# Patient Record
Sex: Female | Born: 1960 | Race: White | Hispanic: No | Marital: Single | Smoking: Current every day smoker
Health system: Southern US, Community
[De-identification: ages and names within clinical notes are randomized; demographics above are authoritative.]

## PROBLEM LIST (undated history)

## (undated) DIAGNOSIS — Z972 Presence of dental prosthetic device (complete) (partial): Secondary | ICD-10-CM

## (undated) DIAGNOSIS — T8859XA Other complications of anesthesia, initial encounter: Secondary | ICD-10-CM

## (undated) DIAGNOSIS — F419 Anxiety disorder, unspecified: Secondary | ICD-10-CM

## (undated) HISTORY — DX: Anxiety disorder, unspecified: F41.9

## (undated) HISTORY — PX: VAGINAL HYSTERECTOMY: SUR661

## (undated) HISTORY — PX: ABDOMINAL HYSTERECTOMY: SHX81

## (undated) HISTORY — PX: TONSILLECTOMY: SUR1361

## (undated) HISTORY — PX: CHOLECYSTECTOMY: SHX55

---

## 2003-01-06 DIAGNOSIS — G4700 Insomnia, unspecified: Secondary | ICD-10-CM | POA: Insufficient documentation

## 2004-08-08 ENCOUNTER — Ambulatory Visit: Payer: Self-pay | Admitting: Internal Medicine

## 2005-12-24 ENCOUNTER — Ambulatory Visit: Payer: Self-pay | Admitting: Family Medicine

## 2006-04-03 ENCOUNTER — Ambulatory Visit: Payer: Self-pay | Admitting: Family Medicine

## 2010-08-13 HISTORY — PX: AUGMENTATION MAMMAPLASTY: SUR837

## 2010-10-14 ENCOUNTER — Ambulatory Visit: Payer: Self-pay | Admitting: Internal Medicine

## 2011-12-30 ENCOUNTER — Ambulatory Visit: Payer: Self-pay | Admitting: Internal Medicine

## 2011-12-30 LAB — COMPREHENSIVE METABOLIC PANEL
Albumin: 3.7 g/dL (ref 3.4–5.0)
Alkaline Phosphatase: 249 U/L — ABNORMAL HIGH (ref 50–136)
Anion Gap: 9 (ref 7–16)
BUN: 13 mg/dL (ref 7–18)
Calcium, Total: 9 mg/dL (ref 8.5–10.1)
Co2: 28 mmol/L (ref 21–32)
Creatinine: 0.87 mg/dL (ref 0.60–1.30)
EGFR (Non-African Amer.): >60
Potassium: 4.1 mmol/L (ref 3.5–5.1)
SGOT(AST): 92 U/L — ABNORMAL HIGH (ref 15–37)
SGPT (ALT): 127 U/L — ABNORMAL HIGH
Sodium: 142 mmol/L (ref 136–145)
Total Protein: 7.1 g/dL (ref 6.4–8.2)

## 2011-12-30 LAB — URINALYSIS, COMPLETE
Bacteria: NEGATIVE
Bilirubin,UR: NEGATIVE
Blood: NEGATIVE
Glucose,UR: NEGATIVE mg/dL (ref 0–75)
Ketone: NEGATIVE
Leukocyte Esterase: NEGATIVE
Nitrite: NEGATIVE
Ph: 6.5 (ref 4.5–8.0)
Protein: NEGATIVE
RBC,UR: NONE SEEN /HPF (ref 0–5)

## 2011-12-30 LAB — CBC WITH DIFFERENTIAL/PLATELET
Basophil #: 0.1 10*3/uL (ref 0.0–0.1)
Basophil %: 0.6 %
Eosinophil #: 0.1 10*3/uL (ref 0.0–0.7)
Eosinophil %: 1.4 %
Lymphocyte #: 3.5 10*3/uL (ref 1.0–3.6)
Lymphocyte %: 39.2 %
MCH: 29 pg (ref 26.0–34.0)
Neutrophil #: 4.6 10*3/uL (ref 1.4–6.5)
Platelet: 192 10*3/uL (ref 150–440)
RBC: 4.71 10*6/uL (ref 3.80–5.20)
RDW: 13.3 % (ref 11.5–14.5)
WBC: 8.8 10*3/uL (ref 3.6–11.0)

## 2012-01-03 ENCOUNTER — Ambulatory Visit: Payer: Self-pay | Admitting: Family Medicine

## 2012-01-29 ENCOUNTER — Ambulatory Visit: Payer: Self-pay | Admitting: Family Medicine

## 2012-01-29 LAB — CBC WITH DIFFERENTIAL/PLATELET
Basophil #: 0 10*3/uL (ref 0.0–0.1)
Eosinophil #: 0.2 10*3/uL (ref 0.0–0.7)
Lymphocyte #: 3.6 10*3/uL (ref 1.0–3.6)
MCH: 29.2 pg (ref 26.0–34.0)
MCHC: 33.9 g/dL (ref 32.0–36.0)
MCV: 86 fL (ref 80–100)
Monocyte #: 0.5 x10 3/mm (ref 0.2–0.9)
Neutrophil #: 5.2 10*3/uL (ref 1.4–6.5)
Platelet: 207 10*3/uL (ref 150–440)
RDW: 13.2 % (ref 11.5–14.5)

## 2012-01-29 LAB — URINALYSIS, COMPLETE
Bilirubin,UR: NEGATIVE
Glucose,UR: NEGATIVE mg/dL (ref 0–75)
Ketone: NEGATIVE
Leukocyte Esterase: NEGATIVE
Protein: NEGATIVE
Specific Gravity: 1.015 (ref 1.003–1.030)

## 2012-01-29 LAB — COMPREHENSIVE METABOLIC PANEL
Alkaline Phosphatase: 222 U/L — ABNORMAL HIGH (ref 50–136)
Anion Gap: 9 (ref 7–16)
BUN: 9 mg/dL (ref 7–18)
Bilirubin,Total: 0.5 mg/dL (ref 0.2–1.0)
Calcium, Total: 9.3 mg/dL (ref 8.5–10.1)
Chloride: 105 mmol/L (ref 98–107)
Creatinine: 0.86 mg/dL (ref 0.60–1.30)
EGFR (Non-African Amer.): >60
Glucose: 92 mg/dL (ref 65–99)
Osmolality: 285 (ref 275–301)
SGPT (ALT): 48 U/L
Sodium: 144 mmol/L (ref 136–145)

## 2012-01-31 LAB — URINE CULTURE

## 2012-05-15 ENCOUNTER — Ambulatory Visit: Payer: Self-pay | Admitting: Medical

## 2012-05-15 ENCOUNTER — Observation Stay: Payer: Self-pay | Admitting: Internal Medicine

## 2012-05-15 LAB — CBC
HCT: 37.4 % (ref 35.0–47.0)
HGB: 12.7 g/dL (ref 12.0–16.0)
MCH: 29.3 pg (ref 26.0–34.0)
MCHC: 34 g/dL (ref 32.0–36.0)
Platelet: 202 10*3/uL (ref 150–440)
RDW: 13 % (ref 11.5–14.5)

## 2012-05-15 LAB — COMPREHENSIVE METABOLIC PANEL
Albumin: 3.6 g/dL (ref 3.4–5.0)
Alkaline Phosphatase: 155 U/L — ABNORMAL HIGH (ref 50–136)
Anion Gap: 6 — ABNORMAL LOW (ref 7–16)
BUN: 11 mg/dL (ref 7–18)
Calcium, Total: 9.1 mg/dL (ref 8.5–10.1)
Chloride: 108 mmol/L — ABNORMAL HIGH (ref 98–107)
Co2: 31 mmol/L (ref 21–32)
Creatinine: 0.81 mg/dL (ref 0.60–1.30)
EGFR (African American): >60
EGFR (Non-African Amer.): >60
Glucose: 88 mg/dL (ref 65–99)
Osmolality: 288 (ref 275–301)
Potassium: 4 mmol/L (ref 3.5–5.1)
SGOT(AST): 144 U/L — ABNORMAL HIGH (ref 15–37)
SGPT (ALT): 117 U/L — ABNORMAL HIGH (ref 12–78)
Sodium: 145 mmol/L (ref 136–145)
Total Protein: 6.7 g/dL (ref 6.4–8.2)

## 2012-05-15 LAB — TROPONIN I: Troponin-I: <0.02 ng/mL

## 2012-05-16 DIAGNOSIS — R079 Chest pain, unspecified: Secondary | ICD-10-CM

## 2012-05-16 LAB — CK TOTAL AND CKMB (NOT AT ARMC)
CK, Total: 156 U/L (ref 21–215)
CK, Total: 166 U/L (ref 21–215)
CK-MB: 1.9 ng/mL (ref 0.5–3.6)
CK-MB: 1.7 ng/mL (ref 0.5–3.6)

## 2012-05-16 LAB — HEPATIC FUNCTION PANEL A (ARMC)
Albumin: 3.3 g/dL — ABNORMAL LOW (ref 3.4–5.0)
Alkaline Phosphatase: 148 U/L — ABNORMAL HIGH (ref 50–136)
Bilirubin, Direct: <0.1 mg/dL (ref 0.00–0.20)
SGOT(AST): 177 U/L — ABNORMAL HIGH (ref 15–37)
SGPT (ALT): 141 U/L — ABNORMAL HIGH (ref 12–78)
Total Protein: 6.2 g/dL — ABNORMAL LOW (ref 6.4–8.2)

## 2012-05-16 LAB — TROPONIN I
Troponin-I: <0.02 ng/mL
Troponin-I: <0.02 ng/mL

## 2012-10-17 ENCOUNTER — Other Ambulatory Visit: Payer: Self-pay | Admitting: Oncology

## 2013-08-13 HISTORY — PX: COLONOSCOPY: SHX5424

## 2013-10-10 ENCOUNTER — Encounter: Payer: Self-pay | Admitting: Oncology

## 2014-08-23 DIAGNOSIS — Z720 Tobacco use: Secondary | ICD-10-CM | POA: Insufficient documentation

## 2014-11-30 NOTE — H&P (Signed)
PATIENT NAME:  Shelby Gallagher MR#:  [id number] DATE OF BIRTH:  02-27-1961  DATE OF ADMISSION:  05/15/2012  PRIMARY CARE PHYSICIAN: Dr. Otilio Miu  REFERRING PHYSICIAN: Dr. Lenise Arena  CHIEF COMPLAINT: Chest pain.   HISTORY OF PRESENT ILLNESS: This is a 54 year old female who presents to the Emergency Room complaining of chest pain that began earlier this evening. She describes a feeling as a heaviness in the center of her chest which is then radiating down her left arm and her left arm became heavy and numb. She also associated with some diaphoresis, some shortness of breath and nausea but no vomiting. She was a passenger in her car when she developed those symptoms. They stopped and she took a couple of baby aspirins which seemed to alleviate some of her pain but it continued to persist therefore she was brought to the ER for further evaluation. In the Emergency Room patient continued to complain of heaviness in the center of her chest. Hospitalist service was contacted for further treatment and evaluation.   Patient denies ever having any symptoms like this before. She denies ever having myocardial infarction or CVA in the past.   REVIEW OF SYSTEMS: CONSTITUTIONAL: No documented fever. No weight gain. No weight loss. EYES: No blurred or double vision. ENT: No tinnitus. No postnasal drip. No redness of the oropharynx. RESPIRATORY: No cough, no wheeze, no hemoptysis. Positive dyspnea. CARDIOVASCULAR: Positive chest pain. No orthopnea, no palpitations, no syncope. GASTROINTESTINAL: Positive nausea. No vomiting, no diarrhea, no abdominal pain, no melena, no hematochezia. GENITOURINARY: No dysuria, no hematuria. ENDOCRINE: No polyuria or nocturia. No heat or cold intolerance. HEME: No anemia, no bruising, no bleeding. INTEGUMENTARY: No rashes. No lesions. MUSCULOSKELETAL: No arthritis, no swelling, no gout. NEUROLOGIC: No numbness, no tingling, no ataxia, no seizure-type activity. PSYCH: No  anxiety, no insomnia, no ADD.   PAST MEDICAL HISTORY: Tobacco abuse.   ALLERGIES: No known drug allergies.   SOCIAL HISTORY: Does smoke about a pack every other day, has smoked for the past 20+ years. Occasional alcohol use. No illicit drug abuse. Lives at home with her boyfriend.   FAMILY HISTORY: Mother is alive and healthy. Father is also alive but has a history of stroke and heart disease.   CURRENT MEDICATIONS: She takes clorazepate 15 mg as needed for insomnia and sleep.   PHYSICAL EXAM ON ADMISSION:  VITAL SIGNS: Vital signs are noted to be: Temperature is afebrile, pulse 89, respirations 18, blood pressure 125/69, sats 100% on 2 liters nasal cannula.   GENERAL: She is a pleasant appearing female in no apparent distress.   HEENT: She is atraumatic, normocephalic. Her extraocular muscles are intact. Pupils equal, reactive to light. Sclerae anicteric. No conjunctival injection. No pharyngeal erythema.   NECK: Supple. There is no jugular venous distention. No bruits, no lymphadenopathy, no thyromegaly.   HEART: Regular rate and rhythm. No murmurs, no rubs, no clicks.   LUNGS: Clear to auscultation bilaterally. No rales, no rhonchi, no wheezes.   ABDOMEN: Soft, flat, nontender, nondistended. Has good bowel sounds. No hepatosplenomegaly appreciated.   EXTREMITIES: No evidence of any cyanosis, clubbing, or peripheral edema. Has +2 pedal and radial pulses bilaterally.   NEUROLOGICAL: Patient is alert, awake, oriented x3 with no focal motor or sensory deficits appreciated bilaterally.   SKIN: Moist, warm with no rash appreciated.   LYMPHATIC: There is no cervical or axillary lymphadenopathy.   LABORATORY, DIAGNOSTIC, AND RADIOLOGICAL DATA: Serum glucose 88, BUN 11, creatinine 0.8, sodium 145, potassium 4,  chloride 108, bicarbonate 31, alkaline phosphatase 155, AST 144, ALT 117, albumin 3.6, troponin less than 0.02, white cell count 8.1, hemoglobin 12.7, hematocrit 37.4, platelet  count 202, d-dimer 0.3, INR 0.9.   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   Patient's EKG also showed normal sinus rhythm with normal axes and no evidence of any acute ST or T wave changes.   ASSESSMENT AND PLAN: This is a 54 year old female with ongoing tobacco abuse, anxiety presents to the hospital with chest pain.  1. Chest pain. Patient does have some typical symptoms for angina. She does have history of tobacco abuse. Her symptoms did mildly improve with some sublingual nitroglycerin and some aspirin therefore I will observe her overnight on telemetry. Follow serial cardiac markers. Continue baby aspirin, nitroglycerin, morphine and oxygen. Will get a stress Myoview in the morning. If her enzymes turn positive then will get cardiology consult for possible cardiac catheterization. I suspect most likely her chest pain is related to anxiety/musculoskeletal in nature.  2. Abnormal liver function tests. Patient does not have any abdominal pain. She says she has had an abnormal liver panel in the past although she does not know why. She does not have any history of heavy alcohol abuse. I will go ahead and check a hepatitis panel. I will also get a limited abdominal ultrasound in the morning and follow her liver function tests.  3. CODE STATUS: Patient is a FULL CODE.   TIME SPENT WITH ADMISSION: 45 minutes.   ____________________________ Belia Heman. Verdell Carmine, MD vjs:cms D: 05/15/2012 20:44:40 ET T: 05/16/2012 06:02:18 ET JOB#: [id number]  cc: Belia Heman. Verdell Carmine, MD, <Dictator> Juline Patch, MD  Henreitta Leber MD ELECTRONICALLY SIGNED 05/16/2012 12:25

## 2014-11-30 NOTE — Discharge Summary (Signed)
PATIENT NAME:  Shelby Gallagher MR#:  [id number] DATE OF BIRTH:  10-Apr-1961  DATE OF ADMISSION:  05/15/2012 DATE OF DISCHARGE:  05/16/2012  PRIMARY CARE PHYSICIAN: Dr. Otilio Miu    DISCHARGE DIAGNOSES:  1. Chest pain secondary to gastroesophageal reflux disease or reflux.  2. Transaminitis chronic with pending hepatitis panel and normal ultrasound.   LABORATORY, DIAGNOSTIC AND RADIOLOGICAL DATA:  Ultrasound of the abdomen which showed an absent gallbladder. No acute abnormalities of the liver.   Cardiac stress test Myoview which was normal.   ADMITTING HISTORY AND PHYSICAL AND HOSPITAL COURSE: Please see detailed history and physical dictated by Dr. Verdell Carmine. In brief, 54 year old patient presented to the Emergency Room complaining of left-sided chest pain radiating to left arm and some numbness associated with nausea. Patient was admitted for stress test. Patient had normal three sets of cardiac enzymes with no chest pain at the time of discharge. Patient was thought to have gastroesophageal reflux disease after a normal stress test, is being started on Prilosec over-the-counter twice a day along with aspirin 81 mg once a day at home and is being discharged home to follow up with her primary care physician. On the day of discharge patient's temperature is 98, blood pressure 104/70, saturating 96% on room air.   Her liver function tests are mildly elevated. Hepatitis panel is pending. Will be forwarded to primary care physician. Ultrasound of the abdomen was normal. This needs to be rechecked in 6 to 8 weeks with LFTs.   DISCHARGE MEDICATIONS:  1. Prilosec over-the-counter 20 mg oral twice a day.  2. Aspirin 81 mg oral once a day.   DISCHARGE INSTRUCTIONS: Follow with primary care physician in a week. Continue medications as prescribed. Patient will call her doctor or return to the Emergency Room if she has any worsening chest pain or shortness of breath.   TIME SPENT: Time spent today on  this discharge dictation along with coordinating care and counseling of the patient was 30 minutes.  ____________________________ Leia Alf Merikay Lesniewski, MD srs:cms D: 05/16/2012 12:46:14 ET T: 05/16/2012 12:57:23 ET JOB#: [id number]  cc: Alveta Heimlich R. Darvin Neighbours, MD, <Dictator> Juline Patch, MD Neita Carp MD ELECTRONICALLY SIGNED 06/09/2012 13:57

## 2015-10-06 ENCOUNTER — Ambulatory Visit: Payer: Self-pay | Admitting: Family Medicine

## 2015-10-12 ENCOUNTER — Encounter: Payer: Self-pay | Admitting: Family Medicine

## 2015-10-12 ENCOUNTER — Ambulatory Visit (INDEPENDENT_AMBULATORY_CARE_PROVIDER_SITE_OTHER): Payer: Self-pay | Admitting: Family Medicine

## 2015-10-12 VITALS — BP 120/80 | HR 80 | Temp 98.1°F | Ht 67.0 in | Wt 188.0 lb

## 2015-10-12 DIAGNOSIS — F410 Panic disorder [episodic paroxysmal anxiety] without agoraphobia: Secondary | ICD-10-CM

## 2015-10-12 DIAGNOSIS — J019 Acute sinusitis, unspecified: Secondary | ICD-10-CM

## 2015-10-12 LAB — POCT INFLUENZA A/B
INFLUENZA A, POC: NEGATIVE
INFLUENZA B, POC: NEGATIVE

## 2015-10-12 MED ORDER — AMOXICILLIN 250 MG/5ML PO SUSR: ORAL | Status: DC

## 2015-10-12 MED ORDER — GUAIFENESIN-CODEINE 100-10 MG/5ML PO SYRP: 5.0000 mL | ORAL_SOLUTION | Freq: Three times a day (TID) | ORAL | Status: DC | PRN

## 2015-10-12 MED ORDER — CLORAZEPATE DIPOTASSIUM 7.5 MG PO TABS: 7.5000 mg | ORAL_TABLET | Freq: Every day | ORAL | Status: DC

## 2015-10-12 MED ORDER — AMOXICILLIN 500 MG PO CAPS: 500.0000 mg | ORAL_CAPSULE | Freq: Three times a day (TID) | ORAL | Status: DC

## 2015-10-12 NOTE — Progress Notes (Signed)
Name: Shelby Gallagher   MRN: [id number]    DOB: 1961/02/16   Date:10/12/2015       Progress Note  Subjective  Chief Complaint  Chief Complaint  Patient presents with  . Sinusitis    cough and cong, sore throat- no production/ dry cough, feels like lymph nodes are swollen  . Anxiety    needs refill on clorazepate    Sinusitis This is a new problem. The current episode started yesterday. The problem has been waxing and waning since onset. Maximum temperature: low grade. Associated symptoms include congestion, coughing, headaches, shortness of breath, sinus pressure and a sore throat. Pertinent negatives include no chills, diaphoresis, ear pain or neck pain. The treatment provided mild relief.  Anxiety Presents for follow-up visit. Symptoms include nervous/anxious behavior and shortness of breath. Patient reports no chest pain, dizziness, insomnia, nausea, palpitations or suicidal ideas.      No problem-specific assessment & plan notes found for this encounter.   Past Medical History  Diagnosis Date  . Anxiety     Past Surgical History  Procedure Laterality Date  . Vaginal hysterectomy    . Cholecystectomy    . Tonsillectomy    . Colonoscopy  2015    repeat in 3 years- Dr Janalyn Rouse- [facility]    Family History  Problem Relation Age of Onset  . Cancer Mother   . Hypertension Mother   . Cancer Father   . Hypertension Father   . Stroke Father     Social History   Social History  . Marital Status: Married    Spouse Name: N/A  . Number of Children: N/A  . Years of Education: N/A   Occupational History  . Not on file.   Social History Main Topics  . Smoking status: Current Every Day Smoker  . Smokeless tobacco: Not on file  . Alcohol Use: 0.0 oz/week    0 Standard drinks or equivalent per week  . Drug Use: No  . Sexual Activity: Yes   Other Topics Concern  . Not on file   Social History Narrative  . No narrative on file    Allergies  Allergen Reactions  .  Morphine     Other reaction(s): VOMITING  . Shellfish-Derived Products     Other reaction(s): SWELLING Other reaction(s): SWELLING     Review of Systems  Constitutional: Negative for fever, chills, weight loss, malaise/fatigue and diaphoresis.  HENT: Positive for congestion, sinus pressure and sore throat. Negative for ear discharge and ear pain.   Eyes: Negative for blurred vision.  Respiratory: Positive for cough and shortness of breath. Negative for sputum production and wheezing.   Cardiovascular: Negative for chest pain, palpitations and leg swelling.  Gastrointestinal: Negative for heartburn, nausea, abdominal pain, diarrhea, constipation, blood in stool and melena.  Genitourinary: Negative for dysuria, urgency, frequency and hematuria.  Musculoskeletal: Negative for myalgias, back pain, joint pain and neck pain.  Skin: Negative for rash.  Neurological: Positive for headaches. Negative for dizziness, tingling, sensory change and focal weakness.  Endo/Heme/Allergies: Negative for environmental allergies and polydipsia. Does not bruise/bleed easily.  Psychiatric/Behavioral: Negative for depression and suicidal ideas. The patient is nervous/anxious. The patient does not have insomnia.      Objective  Filed Vitals:   10/12/15 1119  BP: 120/80  Pulse: 80  Temp: 98.1 F (36.7 C)  TempSrc: Oral  Height: 5\' 7"  (1.702 m)  Weight: 188 lb (85.276 kg)    Physical Exam  Constitutional: She is well-developed, well-nourished,  and in no distress. No distress.  HENT:  Head: Normocephalic and atraumatic.  Right Ear: External ear normal.  Left Ear: External ear normal.  Nose: Nose normal.  Mouth/Throat: Oropharynx is clear and moist.  Eyes: Conjunctivae and EOM are normal. Pupils are equal, round, and reactive to light. Right eye exhibits no discharge. Left eye exhibits no discharge.  Neck: Normal range of motion. Neck supple. No JVD present. No thyromegaly present.  Cardiovascular:  Normal rate, regular rhythm, normal heart sounds and intact distal pulses.  Exam reveals no gallop and no friction rub.   No murmur heard. Pulmonary/Chest: Effort normal and breath sounds normal.  Abdominal: Soft. Bowel sounds are normal. She exhibits no mass. There is no tenderness. There is no guarding.  Musculoskeletal: Normal range of motion. She exhibits no edema.  Lymphadenopathy:    She has no cervical adenopathy.  Neurological: She is alert. She has normal reflexes.  Skin: Skin is warm and dry. She is not diaphoretic.  Psychiatric: Mood and affect normal.  Nursing note and vitals reviewed.     Assessment & Plan  Problem List Items Addressed This Visit    None    Visit Diagnoses    Subacute sinusitis, unspecified location    -  Primary    Relevant Medications    guaiFENesin-codeine (ROBITUSSIN AC) 100-10 MG/5ML syrup    amoxicillin (AMOXIL) 250 MG/5ML suspension    Other Relevant Orders    POCT Influenza A/B (Completed)    Panic attacks        Relevant Medications    clorazepate (TRANXENE) 7.5 MG tablet         Dr. Volanda Mangine [facility]  10/12/2015

## 2015-10-18 ENCOUNTER — Other Ambulatory Visit: Payer: Self-pay

## 2015-10-19 ENCOUNTER — Other Ambulatory Visit: Payer: Self-pay

## 2015-10-19 MED ORDER — LEVOFLOXACIN 500 MG PO TABS: 500.0000 mg | ORAL_TABLET | Freq: Every day | ORAL | Status: DC

## 2016-06-11 ENCOUNTER — Ambulatory Visit (INDEPENDENT_AMBULATORY_CARE_PROVIDER_SITE_OTHER): Payer: Self-pay

## 2016-06-11 DIAGNOSIS — Z23 Encounter for immunization: Secondary | ICD-10-CM

## 2016-09-04 ENCOUNTER — Encounter: Payer: Self-pay | Admitting: Internal Medicine

## 2016-09-04 ENCOUNTER — Ambulatory Visit (INDEPENDENT_AMBULATORY_CARE_PROVIDER_SITE_OTHER): Payer: BLUE CROSS/BLUE SHIELD | Admitting: Internal Medicine

## 2016-09-04 VITALS — BP 142/96 | HR 82 | Temp 98.0°F

## 2016-09-04 DIAGNOSIS — N23 Unspecified renal colic: Secondary | ICD-10-CM

## 2016-09-04 DIAGNOSIS — F5101 Primary insomnia: Secondary | ICD-10-CM | POA: Diagnosis not present

## 2016-09-04 LAB — POCT URINALYSIS DIPSTICK
BILIRUBIN UA: NEGATIVE
GLUCOSE UA: NEGATIVE
Ketones, UA: NEGATIVE
Leukocytes, UA: NEGATIVE
NITRITE UA: NEGATIVE
Protein, UA: NEGATIVE
SPEC GRAV UA: 1.02
UROBILINOGEN UA: 0.2
pH, UA: 6

## 2016-09-04 MED ORDER — CLORAZEPATE DIPOTASSIUM 7.5 MG PO TABS: 7.5000 mg | ORAL_TABLET | Freq: Every day | ORAL | 0 refills | Status: DC

## 2016-09-04 MED ORDER — TAMSULOSIN HCL 0.4 MG PO CAPS: 0.4000 mg | ORAL_CAPSULE | Freq: Every day | ORAL | 0 refills | Status: DC

## 2016-09-04 MED ORDER — KETOROLAC TROMETHAMINE 60 MG/2ML IM SOLN
60.0000 mg | Freq: Once | INTRAMUSCULAR | Status: AC
  Administered 2016-09-04: 60 mg via INTRAMUSCULAR

## 2016-09-04 MED ORDER — HYDROCODONE-ACETAMINOPHEN 5-325 MG PO TABS
1.0000 | ORAL_TABLET | Freq: Four times a day (QID) | ORAL | 0 refills | Status: DC | PRN
Start: 2016-09-04 — End: 2017-06-14

## 2016-09-04 NOTE — Addendum Note (Signed)
Addended by: Glean Hess on: 09/04/2016 04:40 PM   Modules accepted: Orders

## 2016-09-04 NOTE — Progress Notes (Signed)
Date:  09/04/2016   Name:  Shelby Gallagher   DOB:  1961/01/15   MRN:  [id number]   Chief Complaint: Back Pain (Pt stated lower back pain) Back Pain  This is a new problem. The current episode started in the past 7 days. The problem occurs constantly. The problem has been gradually worsening since onset. The pain is present in the lumbar spine. The quality of the pain is described as burning and cramping. Radiates to: to left lower abdomen. The pain is moderate. The pain is the same all the time. Exacerbated by: pain all the same. Associated symptoms include abdominal pain. Pertinent negatives include no chest pain, fever or weakness.  Insomnia  Primary symptoms: difficulty falling asleep, premature morning awakening.  Past treatments include medication (tranxene). The treatment provided significant relief.      Review of Systems  Constitutional: Negative for chills, fatigue and fever.  Respiratory: Negative for chest tightness and shortness of breath.   Cardiovascular: Negative for chest pain and palpitations.  Gastrointestinal: Positive for abdominal pain. Negative for constipation, diarrhea, nausea and vomiting.  Genitourinary: Negative for difficulty urinating and hematuria.  Musculoskeletal: Positive for back pain.  Skin: Negative for color change and rash.  Neurological: Negative for weakness.  Psychiatric/Behavioral: The patient has insomnia.     Patient Active Problem List   Diagnosis Date Noted  . Current tobacco use 08/23/2014  . Cannot sleep 01/06/2003    Prior to Admission medications   Medication Sig Start Date End Date Taking? Authorizing Provider  clorazepate (TRANXENE) 7.5 MG tablet Take 1 tablet (7.5 mg total) by mouth at bedtime. Takes 1 to 2 q month 10/12/15  Yes Juline Patch, MD    Allergies  Allergen Reactions  . Morphine     Other reaction(s): VOMITING  . Shellfish-Derived Products     Other reaction(s): SWELLING Other reaction(s): SWELLING     Past Surgical History:  Procedure Laterality Date  . CHOLECYSTECTOMY    . COLONOSCOPY  2015   repeat in 3 years- Dr Janalyn Rouse- [place]  . TONSILLECTOMY    . VAGINAL HYSTERECTOMY      Social History  Substance Use Topics  . Smoking status: Current Every Day Smoker  . Smokeless tobacco: Current User  . Alcohol use 0.0 oz/week     Medication list has been reviewed and updated.   Physical Exam  Constitutional: She is oriented to person, place, and time. She appears well-developed. She appears distressed.  HENT:  Head: Normocephalic and atraumatic.  Neck: Normal range of motion. Neck supple.  Cardiovascular: Normal rate, regular rhythm and normal heart sounds.   Pulmonary/Chest: Effort normal and breath sounds normal. No respiratory distress. She has no wheezes.  Abdominal: There is tenderness in the left lower quadrant. There is no rigidity and no guarding.  Musculoskeletal: Normal range of motion.       Lumbar back: Normal. She exhibits no tenderness and no spasm.  Neurological: She is alert and oriented to person, place, and time.  SLR negative bilaterally  Skin: Skin is warm and dry. No rash noted.  Psychiatric: She has a normal mood and affect. Her behavior is normal. Thought content normal.  Nursing note and vitals reviewed.   BP (!) 142/96   Pulse 82   Temp 98 F (36.7 C)   SpO2 98%   Assessment and Plan: 1. Renal colic on left side Toradol 30 mg/2 ml given IM; husband here to drive her home - CT RENAL STONE  STUDY; Future - tamsulosin (FLOMAX) 0.4 MG CAPS capsule; Take 1 capsule (0.4 mg total) by mouth daily.  Dispense: 20 capsule; Refill: 0 - HYDROcodone-acetaminophen (NORCO/VICODIN) 5-325 MG tablet; Take 1 tablet by mouth every 6 (six) hours as needed for moderate pain.  Dispense: 20 tablet; Refill: 0 - POCT urinalysis dipstick  2. Primary insomnia - clorazepate (TRANXENE) 7.5 MG tablet; Take 1 tablet (7.5 mg total) by mouth at bedtime. Takes 1 to 2 q month   Dispense: 30 tablet; Refill: 0   Halina Maidens, MD [facility]  09/04/2016

## 2016-09-05 ENCOUNTER — Ambulatory Visit
Admission: RE | Admit: 2016-09-05 | Discharge: 2016-09-05 | Disposition: A | Discharge status: Home / Self Care | Payer: BLUE CROSS/BLUE SHIELD | Source: Ambulatory Visit | Attending: Internal Medicine | Admitting: Internal Medicine

## 2016-09-05 ENCOUNTER — Encounter: Payer: Self-pay | Admitting: Internal Medicine

## 2016-09-05 ENCOUNTER — Other Ambulatory Visit: Payer: Self-pay | Admitting: Internal Medicine

## 2016-09-05 DIAGNOSIS — N23 Unspecified renal colic: Secondary | ICD-10-CM | POA: Insufficient documentation

## 2016-09-05 MED ORDER — GABAPENTIN 100 MG PO CAPS: 100.0000 mg | ORAL_CAPSULE | Freq: Three times a day (TID) | ORAL | 0 refills | Status: DC

## 2016-09-05 MED ORDER — VALACYCLOVIR HCL 1 G PO TABS: 1000.0000 mg | ORAL_TABLET | Freq: Three times a day (TID) | ORAL | 0 refills | Status: DC

## 2016-09-10 ENCOUNTER — Ambulatory Visit
Admission: EM | Admit: 2016-09-10 | Discharge: 2016-09-10 | Disposition: A | Discharge status: Other Acute Inpatient Hospital | Payer: BLUE CROSS/BLUE SHIELD | Attending: Family Medicine | Admitting: Family Medicine

## 2016-09-10 ENCOUNTER — Encounter: Payer: Self-pay | Admitting: Emergency Medicine

## 2016-09-10 ENCOUNTER — Emergency Department
Admission: EM | Admit: 2016-09-10 | Discharge: 2016-09-10 | Disposition: A | Discharge status: Home / Self Care | Payer: BLUE CROSS/BLUE SHIELD | Attending: Emergency Medicine | Admitting: Emergency Medicine

## 2016-09-10 ENCOUNTER — Emergency Department: Payer: BLUE CROSS/BLUE SHIELD

## 2016-09-10 ENCOUNTER — Other Ambulatory Visit: Payer: Self-pay

## 2016-09-10 DIAGNOSIS — N3000 Acute cystitis without hematuria: Secondary | ICD-10-CM

## 2016-09-10 DIAGNOSIS — R109 Unspecified abdominal pain: Secondary | ICD-10-CM

## 2016-09-10 DIAGNOSIS — R1031 Right lower quadrant pain: Secondary | ICD-10-CM

## 2016-09-10 DIAGNOSIS — F17200 Nicotine dependence, unspecified, uncomplicated: Secondary | ICD-10-CM | POA: Diagnosis not present

## 2016-09-10 DIAGNOSIS — Z79899 Other long term (current) drug therapy: Secondary | ICD-10-CM | POA: Diagnosis not present

## 2016-09-10 LAB — COMPREHENSIVE METABOLIC PANEL
ALT: 66 U/L — ABNORMAL HIGH (ref 14–54)
ANION GAP: 7 (ref 5–15)
AST: 68 U/L — ABNORMAL HIGH (ref 15–41)
Albumin: 4.2 g/dL (ref 3.5–5.0)
Alkaline Phosphatase: 189 U/L — ABNORMAL HIGH (ref 38–126)
BILIRUBIN TOTAL: 0.8 mg/dL (ref 0.3–1.2)
BUN: 13 mg/dL (ref 6–20)
CALCIUM: 9.4 mg/dL (ref 8.9–10.3)
CO2: 26 mmol/L (ref 22–32)
Chloride: 104 mmol/L (ref 101–111)
Creatinine, Ser: 0.77 mg/dL (ref 0.44–1.00)
GFR calc non Af Amer: >60 mL/min (ref >60)
GLUCOSE: 95 mg/dL (ref 65–99)
Potassium: 4.5 mmol/L (ref 3.5–5.1)
Sodium: 137 mmol/L (ref 135–145)
TOTAL PROTEIN: 7.5 g/dL (ref 6.5–8.1)

## 2016-09-10 LAB — CBC
HCT: 40.9 % (ref 35.0–47.0)
HEMOGLOBIN: 14.1 g/dL (ref 12.0–16.0)
MCH: 29.2 pg (ref 26.0–34.0)
MCHC: 34.4 g/dL (ref 32.0–36.0)
MCV: 85 fL (ref 80.0–100.0)
PLATELETS: 252 10*3/uL (ref 150–440)
RBC: 4.81 MIL/uL (ref 3.80–5.20)
RDW: 13.1 % (ref 11.5–14.5)
WBC: 9.5 10*3/uL (ref 3.6–11.0)

## 2016-09-10 LAB — URINALYSIS, COMPLETE (UACMP) WITH MICROSCOPIC
Bacteria, UA: NONE SEEN
Bilirubin Urine: NEGATIVE
GLUCOSE, UA: NEGATIVE mg/dL
HGB URINE DIPSTICK: NEGATIVE
Ketones, ur: NEGATIVE mg/dL
NITRITE: NEGATIVE
PH: 6 (ref 5.0–8.0)
PROTEIN: NEGATIVE mg/dL
SPECIFIC GRAVITY, URINE: 1.011 (ref 1.005–1.030)

## 2016-09-10 LAB — LIPASE, BLOOD: Lipase: 34 U/L (ref 11–51)

## 2016-09-10 MED ORDER — HYDROMORPHONE HCL 1 MG/ML IJ SOLN
INTRAMUSCULAR | Status: AC
  Filled 2016-09-10: qty 1

## 2016-09-10 MED ORDER — HYDROMORPHONE HCL 1 MG/ML IJ SOLN
0.5000 mg | Freq: Once | INTRAMUSCULAR | Status: AC
  Administered 2016-09-10: 0.5 mg via INTRAVENOUS

## 2016-09-10 MED ORDER — ONDANSETRON HCL 4 MG PO TABS: 4.0000 mg | ORAL_TABLET | Freq: Three times a day (TID) | ORAL | 0 refills | Status: DC | PRN

## 2016-09-10 MED ORDER — ONDANSETRON HCL 4 MG/2ML IJ SOLN
4.0000 mg | Freq: Once | INTRAMUSCULAR | Status: AC | PRN
  Administered 2016-09-10: 4 mg via INTRAVENOUS
  Filled 2016-09-10: qty 2

## 2016-09-10 MED ORDER — IOPAMIDOL (ISOVUE-300) INJECTION 61%
100.0000 mL | Freq: Once | INTRAVENOUS | Status: AC | PRN
  Administered 2016-09-10: 100 mL via INTRAVENOUS
  Filled 2016-09-10: qty 100

## 2016-09-10 MED ORDER — DICYCLOMINE HCL 20 MG PO TABS: 20.0000 mg | ORAL_TABLET | Freq: Three times a day (TID) | ORAL | 0 refills | Status: DC | PRN

## 2016-09-10 MED ORDER — SODIUM CHLORIDE 0.9 % IV BOLUS (SEPSIS)
1000.0000 mL | Freq: Once | INTRAVENOUS | Status: AC
  Administered 2016-09-10: 1000 mL via INTRAVENOUS

## 2016-09-10 MED ORDER — FENTANYL CITRATE (PF) 100 MCG/2ML IJ SOLN
INTRAMUSCULAR | Status: AC
  Administered 2016-09-10: 50 ug via INTRAVENOUS
  Filled 2016-09-10: qty 2

## 2016-09-10 MED ORDER — PROCHLORPERAZINE EDISYLATE 5 MG/ML IJ SOLN
10.0000 mg | Freq: Once | INTRAMUSCULAR | Status: AC
  Administered 2016-09-10: 10 mg via INTRAVENOUS

## 2016-09-10 MED ORDER — PROCHLORPERAZINE EDISYLATE 5 MG/ML IJ SOLN
INTRAMUSCULAR | Status: AC
  Filled 2016-09-10: qty 2

## 2016-09-10 MED ORDER — IOPAMIDOL (ISOVUE-300) INJECTION 61%
30.0000 mL | Freq: Once | INTRAVENOUS | Status: AC | PRN
  Administered 2016-09-10: 30 mL via ORAL
  Filled 2016-09-10: qty 30

## 2016-09-10 MED ORDER — CIPROFLOXACIN HCL 250 MG PO TABS: 250.0000 mg | ORAL_TABLET | Freq: Two times a day (BID) | ORAL | 0 refills | Status: DC

## 2016-09-10 MED ORDER — FENTANYL CITRATE (PF) 100 MCG/2ML IJ SOLN
50.0000 ug | INTRAMUSCULAR | Status: AC | PRN
  Administered 2016-09-10 (×2): 50 ug via INTRAVENOUS
  Filled 2016-09-10: qty 2

## 2016-09-10 NOTE — ED Triage Notes (Signed)
Patient complains of right sided lower abdominal pain. Patient states that the pain started last week but worsened. Patient states that she had a CT last week for possible kidney stone, but didn't find any.

## 2016-09-10 NOTE — ED Triage Notes (Signed)
Patient to ER from urgent care for c/o RLQ abd pain to r/o appendicitis. Patient unsure of whether she had pain at periumbilical area at any point. Patient also reports nausea.

## 2016-09-10 NOTE — ED Provider Notes (Signed)
[facility]    CSN: [id number] Arrival date & time: 09/10/16  1705     History   Chief Complaint Chief Complaint  Patient presents with  . Abdominal Pain    HPI Shelby Gallagher is a 56 y.o. female.   Patient is a 56 year old white female with right lower abdominal pain. She reports Thursday started having lower abdominal pain. She had a CT scan of abdomen on Friday because it was some microscopic blood in the urine but CT scan is negative for stone study. Since then she's had progressive increased pain with pain suddenly became worse this afternoon. Last time she ate was some time earlier today but now she doesn't have an appetite. She's had a cholecystectomy in the past colonoscopy tonsillectomy and a vaginal hysterectomy before. She is allergic to shellfish and morphine. She is currently a smoker. Mother and father both had hypertension and cancer. The pain was initially more diffuse of the abdomen and radiate to the right now is in the lower right quadrant   The history is provided by the patient and the spouse. No language interpreter was used.  Abdominal Pain  Pain location:  RLQ Pain radiates to:  RLQ Pain severity:  Moderate Onset quality:  Sudden Progression:  Worsening Chronicity:  New Context: previous surgery   Context: not recent illness   Relieved by:  Nothing Worsened by:  Nothing Associated symptoms: nausea   Associated symptoms: no diarrhea     Past Medical History:  Diagnosis Date  . Anxiety     Patient Active Problem List   Diagnosis Date Noted  . Current tobacco use 08/23/2014  . Cannot sleep 01/06/2003    Past Surgical History:  Procedure Laterality Date  . CHOLECYSTECTOMY    . COLONOSCOPY  2015   repeat in 3 years- Dr Janalyn Rouse- [facility]  . TONSILLECTOMY    . VAGINAL HYSTERECTOMY      OB History    No data available       Home Medications    Prior to Admission medications   Medication Sig Start Date End Date Taking?  Authorizing Provider  clorazepate (TRANXENE) 7.5 MG tablet Take 1 tablet (7.5 mg total) by mouth at bedtime. Takes 1 to 2 q month 09/04/16  Yes Glean Hess, MD  HYDROcodone-acetaminophen (NORCO/VICODIN) 5-325 MG tablet Take 1 tablet by mouth every 6 (six) hours as needed for moderate pain. 09/04/16  Yes Glean Hess, MD  valACYclovir (VALTREX) 1000 MG tablet Take 1 tablet (1,000 mg total) by mouth 3 (three) times daily. 09/05/16  Yes Glean Hess, MD  ciprofloxacin (CIPRO) 250 MG tablet Take 1 tablet (250 mg total) by mouth 2 (two) times daily. 09/10/16   Juline Patch, MD  gabapentin (NEURONTIN) 100 MG capsule Take 1-2 capsules (100-200 mg total) by mouth 3 (three) times daily. As needed for pain 09/05/16   Glean Hess, MD    Family History Family History  Problem Relation Age of Onset  . Cancer Mother   . Hypertension Mother   . Cancer Father   . Hypertension Father   . Stroke Father     Social History Social History  Substance Use Topics  . Smoking status: Current Every Day Smoker  . Smokeless tobacco: Current User  . Alcohol use 0.0 oz/week     Allergies   Morphine and Shellfish-derived products   Review of Systems Review of Systems  Gastrointestinal: Positive for abdominal pain and nausea. Negative for blood in  stool and diarrhea.  All other systems reviewed and are negative.    Physical Exam Triage Vital Signs ED Triage Vitals  Enc Vitals Group     BP 09/10/16 1713 (!) 138/59     Pulse Rate 09/10/16 1713 94     Resp 09/10/16 1713 17     Temp 09/10/16 1713 98.1 F (36.7 C)     Temp Source 09/10/16 1713 Oral     SpO2 09/10/16 1713 98 %     Weight 09/10/16 1713 170 lb (77.1 kg)     Height 09/10/16 1713 5\' 7"  (1.702 m)     Head Circumference --      Peak Flow --      Pain Score 09/10/16 1714 10     Pain Loc --      Pain Edu? --      Excl. in [place]? --    No data found.   Updated Vital Signs BP (!) 138/59 (BP Location: Left Arm)   Pulse 94    Temp 98.1 F (36.7 C) (Oral)   Resp 17   Ht 5\' 7"  (1.702 m)   Wt 170 lb (77.1 kg)   SpO2 98%   BMI 26.63 kg/m   Visual Acuity Right Eye Distance:   Left Eye Distance:   Bilateral Distance:    Right Eye Near:   Left Eye Near:    Bilateral Near:     Physical Exam  Constitutional: She is oriented to person, place, and time. She appears well-developed and well-nourished.  HENT:  Head: Normocephalic and atraumatic.  Eyes: Conjunctivae are normal. Pupils are equal, round, and reactive to light.  Neck: Normal range of motion. Neck supple.  Cardiovascular: Normal rate, regular rhythm and normal heart sounds.   Pulmonary/Chest: Effort normal.  Abdominal: Soft. She exhibits no mass. There is no hepatosplenomegaly. There is tenderness in the right lower quadrant. There is no rebound. No hernia.    Musculoskeletal: Normal range of motion.  Neurological: She is alert and oriented to person, place, and time. No cranial nerve deficit.  Skin: Skin is warm. No rash noted.  Psychiatric: She has a normal mood and affect.     UC Treatments / Results  Labs (all labs ordered are listed, but only abnormal results are displayed) Labs Reviewed - No data to display  EKG  EKG Interpretation None       Radiology No results found.  Procedures Procedures (including critical care time)  Medications Ordered in UC Medications - No data to display   Initial Impression / Assessment and Plan / UC Course  I have reviewed the triage vital signs and the nursing notes.  Pertinent labs & imaging results that were available during my care of the patient were reviewed by me and considered in my medical decision making (see chart for details).   patient with right lower abdominal pain concerning for possible appendicitis. No CT scans available strongly recommend IV contrast may pain medication and pain management. Recommend transferring to [facility] ED for further evaluation. Nurse Nira Conn will contact  charge nurse [facility] ED  Final Clinical Impressions(s) / UC Diagnoses   Final diagnoses:  Abdominal pain, RLQ (right lower quadrant)    New Prescriptions Discharge Medication List as of 09/10/2016  5:58 PM        Note: This dictation was prepared with Dragon dictation along with smaller phrase technology. Any transcriptional errors that result from this process are unintentional.   Frederich Cha, MD 09/10/16 1806

## 2016-09-10 NOTE — ED Notes (Signed)
Report called to Maudie Mercury, triage nurse at [facility] ED.

## 2016-09-10 NOTE — Discharge Instructions (Signed)
Please seek medical attention for any high fevers, chest pain, shortness of breath, change in behavior, persistent vomiting, bloody stool or any other new or concerning symptoms.  

## 2016-09-10 NOTE — ED Provider Notes (Signed)
[facility] Emergency Department Provider Note  ____________________________________________   I have reviewed the triage vital signs and the nursing notes.   HISTORY  Chief Complaint Abdominal Pain   History limited by: Not Limited   HPI Murlean Iba is a 56 y.o. female who presents to the emergency department today from urgent care because of concerns for abdominal pain and possible appendicitis. The patient has been having abdominal pain for the past few days. Initially the pain started in the left side and she actually underwent a CT renal stone study given concern for possible kidney stone. Today however the pain is worse on the right lower quadrant. It is severe.  Patient has not appreciated any fevers. Some associated nausea but no vomiting.    Past Medical History:  Diagnosis Date  . Anxiety     Patient Active Problem List   Diagnosis Date Noted  . Current tobacco use 08/23/2014  . Cannot sleep 01/06/2003    Past Surgical History:  Procedure Laterality Date  . CHOLECYSTECTOMY    . COLONOSCOPY  2015   repeat in 3 years- Dr Janalyn Rouse- [place]  . TONSILLECTOMY    . VAGINAL HYSTERECTOMY      Prior to Admission medications   Medication Sig Start Date End Date Taking? Authorizing Provider  ciprofloxacin (CIPRO) 250 MG tablet Take 1 tablet (250 mg total) by mouth 2 (two) times daily. 09/10/16   Juline Patch, MD  clorazepate (TRANXENE) 7.5 MG tablet Take 1 tablet (7.5 mg total) by mouth at bedtime. Takes 1 to 2 q month 09/04/16   Glean Hess, MD  gabapentin (NEURONTIN) 100 MG capsule Take 1-2 capsules (100-200 mg total) by mouth 3 (three) times daily. As needed for pain 09/05/16   Glean Hess, MD  HYDROcodone-acetaminophen (NORCO/VICODIN) 5-325 MG tablet Take 1 tablet by mouth every 6 (six) hours as needed for moderate pain. 09/04/16   Glean Hess, MD  valACYclovir (VALTREX) 1000 MG tablet Take 1 tablet (1,000 mg total) by mouth 3  (three) times daily. 09/05/16   Glean Hess, MD    Allergies Morphine and Shellfish-derived products  Family History  Problem Relation Age of Onset  . Cancer Mother   . Hypertension Mother   . Cancer Father   . Hypertension Father   . Stroke Father     Social History Social History  Substance Use Topics  . Smoking status: Current Every Day Smoker  . Smokeless tobacco: Current User  . Alcohol use 0.0 oz/week    Review of Systems  Constitutional: Negative for fever. Cardiovascular: Negative for chest pain. Respiratory: Negative for shortness of breath. Gastrointestinal: Positive for right lower quadrant pain. Neurological: Negative for headaches, focal weakness or numbness.  10-point ROS otherwise negative.  ____________________________________________   PHYSICAL EXAM:  VITAL SIGNS: ED Triage Vitals  Enc Vitals Group     BP 09/10/16 1837 (!) 146/77     Pulse Rate 09/10/16 1837 85     Resp 09/10/16 1837 18     Temp 09/10/16 1837 98.3 F (36.8 C)     Temp Source 09/10/16 1837 Oral     SpO2 09/10/16 1837 98 %     Weight 09/10/16 1838 175 lb (79.4 kg)     Height 09/10/16 1838 5\' 7"  (1.702 m)     Head Circumference --      Peak Flow --      Pain Score 09/10/16 1837 10   Constitutional: Alert and oriented. Well  appearing and in no distress. Eyes: Conjunctivae are normal. Normal extraocular movements. ENT   Head: Normocephalic and atraumatic.   Nose: No congestion/rhinnorhea.   Mouth/Throat: Mucous membranes are moist.   Neck: No stridor. Hematological/Lymphatic/Immunilogical: No cervical lymphadenopathy. Cardiovascular: Normal rate, regular rhythm.  No murmurs, rubs, or gallops. Respiratory: Normal respiratory effort without tachypnea nor retractions. Breath sounds are clear and equal bilaterally. No wheezes/rales/rhonchi. Gastrointestinal: Soft and tender to palpation in the right lower quadrant.  Genitourinary: Deferred Musculoskeletal:  Normal range of motion in all extremities. No lower extremity edema. Neurologic:  Normal speech and language. No gross focal neurologic deficits are appreciated.  Skin:  Skin is warm, dry and intact. No rash noted. Psychiatric: Mood and affect are normal. Speech and behavior are normal. Patient exhibits appropriate insight and judgment.  ____________________________________________    LABS (pertinent positives/negatives)  Labs Reviewed  COMPREHENSIVE METABOLIC PANEL - Abnormal; Notable for the following:       Result Value   AST 68 (*)    ALT 66 (*)    Alkaline Phosphatase 189 (*)    All other components within normal limits  URINALYSIS, COMPLETE (UACMP) WITH MICROSCOPIC - Abnormal; Notable for the following:    Color, Urine STRAW (*)    APPearance CLEAR (*)    Leukocytes, UA TRACE (*)    Squamous Epithelial / LPF 0-5 (*)    All other components within normal limits  LIPASE, BLOOD  CBC     ____________________________________________   EKG  None  ____________________________________________    RADIOLOGY  CT abd/pel IMPRESSION:  1. Vague soft tissue inflammation and mild mucosal edema along the  ascending and transverse colon, with mild fatty infiltration of the  wall of the colon, raising concern for an infectious or inflammatory  process.  2. Mild bibasilar emphysema noted.  3. Scattered aortic atherosclerosis noted.      ____________________________________________   PROCEDURES  Procedures  ____________________________________________   INITIAL IMPRESSION / ASSESSMENT AND PLAN / ED COURSE  Pertinent labs & imaging results that were available during my care of the patient were reviewed by me and considered in my medical decision making (see chart for details).  Patient here with abdominal pain. CT shows some possible inflammation around the transverse and ascending colon. No fevers. No leukocytosis. At this point think likely viral in nature. Will  plan on discharge home with bentyl and antiemetics.   ____________________________________________   FINAL CLINICAL IMPRESSION(S) / ED DIAGNOSES  Final diagnoses:  Abdominal pain, unspecified abdominal location     Note: This dictation was prepared with Dragon dictation. Any transcriptional errors that result from this process are unintentional     Nance Pear, MD 09/10/16 2233

## 2016-09-10 NOTE — ED Notes (Addendum)
Pt has right lower abd pain.  Sx for several days.  Pt reports nausea.  No vag bleeding.  No diarrhea.  Pt sent to er for eval of appendicitis.   Pt had a negative renal stone study last week.     Pt alert.  Family with pt.

## 2016-10-22 ENCOUNTER — Other Ambulatory Visit: Payer: Self-pay | Admitting: Internal Medicine

## 2017-06-14 ENCOUNTER — Ambulatory Visit
Admission: RE | Admit: 2017-06-14 | Discharge: 2017-06-14 | Disposition: A | Discharge status: Home / Self Care | Payer: BLUE CROSS/BLUE SHIELD | Source: Ambulatory Visit | Attending: Family Medicine | Admitting: Family Medicine

## 2017-06-14 ENCOUNTER — Encounter: Payer: Self-pay | Admitting: Family Medicine

## 2017-06-14 ENCOUNTER — Ambulatory Visit (INDEPENDENT_AMBULATORY_CARE_PROVIDER_SITE_OTHER): Payer: BLUE CROSS/BLUE SHIELD | Admitting: Family Medicine

## 2017-06-14 VITALS — BP 120/82 | HR 80 | Ht 67.0 in | Wt 175.0 lb

## 2017-06-14 DIAGNOSIS — M7592 Shoulder lesion, unspecified, left shoulder: Secondary | ICD-10-CM | POA: Diagnosis not present

## 2017-06-14 DIAGNOSIS — R918 Other nonspecific abnormal finding of lung field: Secondary | ICD-10-CM | POA: Diagnosis not present

## 2017-06-14 DIAGNOSIS — F5101 Primary insomnia: Secondary | ICD-10-CM

## 2017-06-14 DIAGNOSIS — N390 Urinary tract infection, site not specified: Secondary | ICD-10-CM | POA: Diagnosis not present

## 2017-06-14 DIAGNOSIS — R05 Cough: Secondary | ICD-10-CM

## 2017-06-14 DIAGNOSIS — R059 Cough, unspecified: Secondary | ICD-10-CM

## 2017-06-14 DIAGNOSIS — J984 Other disorders of lung: Secondary | ICD-10-CM | POA: Insufficient documentation

## 2017-06-14 DIAGNOSIS — R079 Chest pain, unspecified: Secondary | ICD-10-CM

## 2017-06-14 DIAGNOSIS — G510 Bell's palsy: Secondary | ICD-10-CM

## 2017-06-14 DIAGNOSIS — J0100 Acute maxillary sinusitis, unspecified: Secondary | ICD-10-CM

## 2017-06-14 LAB — POCT URINALYSIS DIPSTICK
Bilirubin, UA: NEGATIVE
Blood, UA: NEGATIVE
GLUCOSE UA: NEGATIVE
KETONES UA: NEGATIVE
Leukocytes, UA: NEGATIVE
Nitrite, UA: NEGATIVE
Protein, UA: NEGATIVE
SPEC GRAV UA: 1.02 (ref 1.010–1.025)
UROBILINOGEN UA: 0.2 U/dL
pH, UA: 6 (ref 5.0–8.0)

## 2017-06-14 MED ORDER — ACYCLOVIR 200 MG PO CAPS: 200.0000 mg | ORAL_CAPSULE | Freq: Three times a day (TID) | ORAL | 0 refills | Status: DC

## 2017-06-14 MED ORDER — PREDNISONE 10 MG PO TABS
10.0000 mg | ORAL_TABLET | Freq: Every day | ORAL | 0 refills | Status: DC
Start: 2017-06-14 — End: 2018-01-02

## 2017-06-14 MED ORDER — CLORAZEPATE DIPOTASSIUM 7.5 MG PO TABS: 7.5000 mg | ORAL_TABLET | Freq: Every day | ORAL | 0 refills | Status: DC

## 2017-06-14 MED ORDER — AMOXICILLIN 500 MG PO CAPS: 500.0000 mg | ORAL_CAPSULE | Freq: Three times a day (TID) | ORAL | 0 refills | Status: DC

## 2017-06-14 NOTE — Patient Instructions (Signed)
Bell Palsy, Adult Bell palsy is a short-term inability to move muscles in part of the face. The inability to move (paralysis) results from inflammation or compression of the facial nerve, which travels along the skull and under the ear to the side of the face (7th cranial nerve). This nerve is responsible for facial movements that include blinking, closing the eyes, smiling, and frowning. What are the causes? The exact cause of this condition is not known. It may be caused by an infection from a virus, such as the chickenpox (herpes zoster), Epstein-Barr, or mumps virus. What increases the risk? You are more likely to develop this condition if:  You are pregnant.  You have diabetes.  You have had a recent infection in your nose, throat, or airways (upper respiratory infection).  You have a weakened body defense system (immune system).  You have had a facial injury, such as a fracture.  You have a family history of Bell palsy.  What are the signs or symptoms? Symptoms of this condition include:  Weakness on one side of the face.  Drooping eyelid and corner of the mouth.  Excessive tearing in one eye.  Difficulty closing the eyelid.  Dry eye.  Drooling.  Dry mouth.  Changes in taste.  Change in facial appearance.  Pain behind one ear.  Ringing in one or both ears.  Sensitivity to sound in one ear.  Facial twitching.  Headache.  Impaired speech.  Dizziness.  Difficulty eating or drinking.  Most of the time, only one side of the face is affected. Rarely, Bell palsy affects the whole face. How is this diagnosed? This condition is diagnosed based on:  Your symptoms.  Your medical history.  A physical exam.  You may also have to see health care providers who specialize in disorders of the nerves (neurologist) or diseases and conditions of the eye (ophthalmologist). You may have tests, such as:  A test to check for nerve damage (electromyogram).  Imaging  studies, such as CT or MRI scans.  Blood tests.  How is this treated? This condition affects every person differently. Sometimes symptoms go away without treatment within a couple weeks. If treatment is needed, it varies from person to person. The goal of treatment is to reduce inflammation and protect the eye from damage. Treatment for Bell palsy may include:  Medicines, such as: ? Steroids to reduce swelling and inflammation. ? Antiviral drugs. ? Pain relievers, including aspirin, acetaminophen, or ibuprofen.  Eye drops or ointment to keep your eye moist.  Eye protection, if you cannot close your eye.  Exercises or massage to regain muscle strength and function (physical therapy).  Follow these instructions at home:  Take over-the-counter and prescription medicines only as told by your health care provider.  If your eye is affected: ? Keep your eye moist with eye drops or ointment as told by your health care provider. ? Follow instructions for eye care and protection as told by your health care provider.  Do any physical therapy exercises as told by your health care provider.  Keep all follow-up visits as told by your health care provider. This is important. Contact a health care provider if:  You have a fever.  Your symptoms do not get better within 2-3 weeks, or your symptoms get worse.  Your eye is red, irritated, or painful.  You have new symptoms. Get help right away if:  You have weakness or numbness in a part of your body other than your face.    You have trouble swallowing.  You develop neck pain or stiffness.  You develop dizziness or shortness of breath. Summary  Bell palsy is a short-term inability to move muscles in part of the face. The inability to move (paralysis) results from inflammation or compression of the facial nerve.  This condition affects every person differently. Sometimes symptoms go away without treatment within a couple weeks.  If  treatment is needed, it varies from person to person. The goal of treatment is to reduce inflammation and protect the eye from damage.  Contact your health care provider if your symptoms do not get better within 2-3 weeks, or your symptoms get worse. This information is not intended to replace advice given to you by your health care provider. Make sure you discuss any questions you have with your health care provider. Document Released: 07/30/2005 Document Revised: 10/02/2016 Document Reviewed: 10/02/2016 Elsevier Interactive Patient Education  2018 [employer]  

## 2017-06-14 NOTE — Progress Notes (Signed)
Name: Shelby Gallagher   MRN: [id number]    DOB: 1960-11-08   Date:06/14/2017       Progress Note  Subjective  Chief Complaint  Chief Complaint  Patient presents with  . Sinusitis    cough with some green/ clear production- taking Nyquil. Sore throat  . Shoulder Pain    wants an xray- hurting x 2 months    Sinusitis  This is a new problem. The current episode started in the past 7 days (2 days). The problem is unchanged. There has been no fever. The pain is mild. Associated symptoms include congestion, coughing, headaches, a hoarse voice, neck pain, sinus pressure, sneezing and a sore throat. Pertinent negatives include no chills, diaphoresis, ear pain, shortness of breath or swollen glands. (Left frontal headache) Past treatments include oral decongestants. The treatment provided moderate relief.  Shoulder Pain   The pain is present in the left shoulder. This is a new problem. The current episode started more than 1 month ago (2 months). The problem occurs constantly. The problem has been waxing and waning. The quality of the pain is described as aching. The pain is at a severity of 8/10. The pain is moderate. Pertinent negatives include no fever, limited range of motion or tingling. The treatment provided moderate relief.  Cough  This is a new problem. The current episode started in the past 7 days. The problem has been waxing and waning. The cough is non-productive. Associated symptoms include headaches, postnasal drip and a sore throat. Pertinent negatives include no chest pain, chills, ear pain, fever, heartburn, myalgias, rash, shortness of breath, weight loss or wheezing. The treatment provided moderate relief. There is no history of environmental allergies.  Chest Pain   This is a new problem. The current episode started 1 to 4 weeks ago. The problem occurs intermittently (couple of times). The problem has been gradually improving. The pain is present in the substernal region. The quality  of the pain is described as tightness. The pain radiates to the left shoulder. Associated symptoms include a cough, exertional chest pressure and headaches. Pertinent negatives include no abdominal pain, back pain, diaphoresis, dizziness, fever, irregular heartbeat, malaise/fatigue, nausea, palpitations, shortness of breath or sputum production.  Urinary Tract Infection   This is a recurrent problem. The current episode started more than 1 year ago. The problem occurs intermittently. The quality of the pain is described as burning. Pertinent negatives include no chills, frequency, hematuria, nausea or urgency.    No problem-specific Assessment & Plan notes found for this encounter.   Past Medical History:  Diagnosis Date  . Anxiety     Past Surgical History:  Procedure Laterality Date  . CHOLECYSTECTOMY    . COLONOSCOPY  2015   repeat in 3 years- Dr Janalyn Rouse- [place]  . TONSILLECTOMY    . VAGINAL HYSTERECTOMY      Family History  Problem Relation Age of Onset  . Cancer Mother   . Hypertension Mother   . Cancer Father   . Hypertension Father   . Stroke Father     Social History   Social History  . Marital status: Single    Spouse name: N/A  . Number of children: N/A  . Years of education: N/A   Occupational History  . Not on file.   Social History Main Topics  . Smoking status: Current Every Day Smoker  . Smokeless tobacco: Current User  . Alcohol use 0.0 oz/week  . Drug use: No  . Sexual  activity: Yes   Other Topics Concern  . Not on file   Social History Narrative  . No narrative on file    Allergies  Allergen Reactions  . Morphine     Other reaction(s): VOMITING  . Shellfish-Derived Products     Other reaction(s): SWELLING Other reaction(s): SWELLING    Outpatient Medications Prior to Visit  Medication Sig Dispense Refill  . clorazepate (TRANXENE) 7.5 MG tablet Take 1 tablet (7.5 mg total) by mouth at bedtime. Takes 1 to 2 q month 30 tablet 0  .  ciprofloxacin (CIPRO) 250 MG tablet Take 1 tablet (250 mg total) by mouth 2 (two) times daily. 6 tablet 0  . dicyclomine (BENTYL) 20 MG tablet Take 1 tablet (20 mg total) by mouth 3 (three) times daily as needed (abdominal pain). 30 tablet 0  . gabapentin (NEURONTIN) 100 MG capsule Take 1-2 capsules (100-200 mg total) by mouth 3 (three) times daily. As needed for pain 90 capsule 0  . HYDROcodone-acetaminophen (NORCO/VICODIN) 5-325 MG tablet Take 1 tablet by mouth every 6 (six) hours as needed for moderate pain. 20 tablet 0  . ondansetron (ZOFRAN) 4 MG tablet Take 1 tablet (4 mg total) by mouth every 8 (eight) hours as needed for nausea or vomiting. 20 tablet 0  . valACYclovir (VALTREX) 1000 MG tablet Take 1 tablet (1,000 mg total) by mouth 3 (three) times daily. 21 tablet 0   No facility-administered medications prior to visit.     Review of Systems  Constitutional: Negative for chills, diaphoresis, fever, malaise/fatigue and weight loss.  HENT: Positive for congestion, hoarse voice, postnasal drip, sinus pressure, sneezing and sore throat. Negative for ear discharge and ear pain.   Eyes: Negative for blurred vision.  Respiratory: Positive for cough. Negative for sputum production, shortness of breath and wheezing.   Cardiovascular: Negative for chest pain, palpitations and leg swelling.  Gastrointestinal: Negative for abdominal pain, blood in stool, constipation, diarrhea, heartburn, melena and nausea.  Genitourinary: Negative for dysuria, frequency, hematuria and urgency.  Musculoskeletal: Positive for neck pain. Negative for back pain, joint pain and myalgias.  Skin: Negative for rash.  Neurological: Positive for headaches. Negative for dizziness, tingling, sensory change and focal weakness.  Endo/Heme/Allergies: Negative for environmental allergies and polydipsia. Does not bruise/bleed easily.  Psychiatric/Behavioral: Negative for depression and suicidal ideas. The patient is not  nervous/anxious and does not have insomnia.      Objective  Vitals:   06/14/17 1430  BP: 120/82  Pulse: 80  Weight: 175 lb (79.4 kg)  Height: 5\' 7"  (1.702 m)    Physical Exam  Constitutional: She is well-developed, well-nourished, and in no distress. No distress.  HENT:  Head: Normocephalic and atraumatic.  Right Ear: External ear normal.  Left Ear: External ear normal.  Nose: Nose normal.  Mouth/Throat: Oropharynx is clear and moist.  Eyes: Pupils are equal, round, and reactive to light. Conjunctivae and EOM are normal. Right eye exhibits no discharge. Left eye exhibits no discharge.  Neck: Normal range of motion. Neck supple. No JVD present. No thyromegaly present.  Cardiovascular: Normal rate, regular rhythm, normal heart sounds and intact distal pulses.  Exam reveals no gallop and no friction rub.   No murmur heard. Pulmonary/Chest: Effort normal and breath sounds normal. She has no wheezes. She has no rales.  Abdominal: Soft. Bowel sounds are normal. She exhibits no mass. There is no tenderness. There is no guarding.  Musculoskeletal: Normal range of motion. She exhibits no edema.  Left shoulder: She exhibits tenderness.  supraspinatis  Lymphadenopathy:    She has no cervical adenopathy.  Neurological: She is alert. She has normal sensation and normal reflexes. She displays weakness and facial asymmetry. A cranial nerve deficit is present. No sensory deficit.  Left facial droop/ptosis  Skin: Skin is warm and dry. She is not diaphoretic.  Psychiatric: Mood and affect normal.  Nursing note and vitals reviewed.     Assessment & Plan  Problem List Items Addressed This Visit      Other   Cannot sleep   Relevant Medications   clorazepate (TRANXENE) 7.5 MG tablet    Other Visit Diagnoses    Chest pain, unspecified type    -  Primary   Relevant Orders   EKG 12-Lead (Completed)   Ambulatory referral to Cardiology   Bell's palsy       Relevant Medications    clorazepate (TRANXENE) 7.5 MG tablet   Other Relevant Orders   POCT urinalysis dipstick (Completed)   Left supraspinatus tendinitis       Relevant Medications   predniSONE (DELTASONE) 10 MG tablet   Other Relevant Orders   Ambulatory referral to Orthopedic Surgery   Acute non-recurrent maxillary sinusitis       Relevant Medications   amoxicillin (AMOXIL) 500 MG capsule   predniSONE (DELTASONE) 10 MG tablet   acyclovir (ZOVIRAX) 200 MG capsule   Cough       Relevant Orders   DG Chest 2 View   Recurrent UTI       Relevant Medications   acyclovir (ZOVIRAX) 200 MG capsule      Meds ordered this encounter  Medications  . amoxicillin (AMOXIL) 500 MG capsule    Sig: Take 1 capsule (500 mg total) by mouth 3 (three) times daily.    Dispense:  30 capsule    Refill:  0  . predniSONE (DELTASONE) 10 MG tablet    Sig: Take 1 tablet (10 mg total) by mouth daily with breakfast.    Dispense:  30 tablet    Refill:  0  . acyclovir (ZOVIRAX) 200 MG capsule    Sig: Take 1 capsule (200 mg total) by mouth 3 (three) times daily.    Dispense:  30 capsule    Refill:  0  . clorazepate (TRANXENE) 7.5 MG tablet    Sig: Take 1 tablet (7.5 mg total) by mouth at bedtime. Takes 1 to 2 q month    Dispense:  30 tablet    Refill:  0      Dr. Otilio Miu [facility]  06/14/17

## 2017-06-18 DIAGNOSIS — I208 Other forms of angina pectoris: Secondary | ICD-10-CM | POA: Insufficient documentation

## 2017-06-18 DIAGNOSIS — I10 Essential (primary) hypertension: Secondary | ICD-10-CM | POA: Insufficient documentation

## 2017-06-24 ENCOUNTER — Other Ambulatory Visit: Payer: Self-pay

## 2017-06-24 DIAGNOSIS — J019 Acute sinusitis, unspecified: Secondary | ICD-10-CM

## 2017-06-24 MED ORDER — LEVOFLOXACIN 500 MG PO TABS
500.0000 mg | ORAL_TABLET | Freq: Every day | ORAL | 0 refills | Status: DC
Start: 2017-06-24 — End: 2017-07-19

## 2017-06-24 NOTE — Telephone Encounter (Signed)
Pt called and said she was better- but not completely well. Sent in [place] and told to come in if not better

## 2017-07-19 ENCOUNTER — Other Ambulatory Visit: Payer: Self-pay

## 2017-07-19 DIAGNOSIS — J019 Acute sinusitis, unspecified: Secondary | ICD-10-CM

## 2017-07-19 DIAGNOSIS — F5101 Primary insomnia: Secondary | ICD-10-CM

## 2017-07-19 MED ORDER — CLORAZEPATE DIPOTASSIUM 7.5 MG PO TABS: 7.5000 mg | ORAL_TABLET | Freq: Every day | ORAL | 0 refills | Status: DC

## 2017-10-14 ENCOUNTER — Other Ambulatory Visit: Payer: Self-pay

## 2017-10-14 DIAGNOSIS — F5101 Primary insomnia: Secondary | ICD-10-CM

## 2017-10-14 MED ORDER — CLORAZEPATE DIPOTASSIUM 7.5 MG PO TABS: 7.5000 mg | ORAL_TABLET | Freq: Every day | ORAL | 0 refills | Status: DC

## 2018-01-02 ENCOUNTER — Ambulatory Visit: Payer: BLUE CROSS/BLUE SHIELD | Admitting: Family Medicine

## 2018-01-02 ENCOUNTER — Encounter: Payer: Self-pay | Admitting: Family Medicine

## 2018-01-02 VITALS — BP 130/80 | HR 64 | Ht 67.0 in | Wt 176.0 lb

## 2018-01-02 DIAGNOSIS — F5101 Primary insomnia: Secondary | ICD-10-CM | POA: Diagnosis not present

## 2018-01-02 DIAGNOSIS — Z23 Encounter for immunization: Secondary | ICD-10-CM

## 2018-01-02 DIAGNOSIS — F17210 Nicotine dependence, cigarettes, uncomplicated: Secondary | ICD-10-CM | POA: Diagnosis not present

## 2018-01-02 DIAGNOSIS — T753XXD Motion sickness, subsequent encounter: Secondary | ICD-10-CM

## 2018-01-02 MED ORDER — NICOTINE 21 MG/24HR TD PT24: 21.0000 mg | MEDICATED_PATCH | Freq: Every day | TRANSDERMAL | 0 refills | Status: DC

## 2018-01-02 MED ORDER — SCOPOLAMINE 1 MG/3DAYS TD PT72: 1.0000 | MEDICATED_PATCH | TRANSDERMAL | 12 refills | Status: DC

## 2018-01-02 MED ORDER — CLORAZEPATE DIPOTASSIUM 7.5 MG PO TABS: 7.5000 mg | ORAL_TABLET | Freq: Every day | ORAL | 3 refills | Status: DC

## 2018-01-02 NOTE — Progress Notes (Signed)
Name: Shelby Gallagher   MRN: [id number]    DOB: 1961/05/26   Date:01/02/2018       Progress Note  Subjective  Chief Complaint  Chief Complaint  Patient presents with  . Nicotine Dependence  . motion sickness    needs Scopolamine patches for boat trip  . Anxiety    is given #30 to last 90 days- wants to increase the amount given    Nicotine Dependence  Presents for initial visit. Symptoms include insomnia. Symptoms are negative for cravings, decreased concentration, fatigue, headache, irritability and sore throat. Preferred tobacco types include cigarettes. Preferred cigarette types include filtered. Her urge triggers include company of smokers, driving, meal time and stress. Risk factors do not include drinking alcohol or drinking coffee.She smokes 1 pack of cigarettes per day. She started smoking when she was 76-68 years old. Past treatments include nicotine gum. The treatment provided complete relief. Compliance with prior treatments has been variable. Shelby Gallagher is ready to quit. Shelby Gallagher has tried to quit 1 time.  Anxiety  Presents for follow-up visit. Symptoms include insomnia and nervous/anxious behavior. Patient reports no chest pain, compulsions, confusion, decreased concentration, depressed mood, dizziness, dry mouth, excessive worry, feeling of choking, hyperventilation, impotence, irritability, malaise, muscle tension, nausea, obsessions, palpitations, panic, restlessness, shortness of breath or suicidal ideas. Symptoms occur occasionally.      No problem-specific Assessment & Plan notes found for this encounter.   Past Medical History:  Diagnosis Date  . Anxiety     Past Surgical History:  Procedure Laterality Date  . CHOLECYSTECTOMY    . COLONOSCOPY  2015   repeat in 3 years- Dr Janalyn Rouse- [place]  . TONSILLECTOMY    . VAGINAL HYSTERECTOMY      Family History  Problem Relation Age of Onset  . Cancer Mother   . Hypertension Mother   . Cancer Father   . Hypertension Father    . Stroke Father     Social History   Socioeconomic History  . Marital status: Single    Spouse name: Not on file  . Number of children: Not on file  . Years of education: Not on file  . Highest education level: Not on file  Occupational History  . Not on file  Social Needs  . Financial resource strain: Not on file  . Food insecurity:    Worry: Not on file    Inability: Not on file  . Transportation needs:    Medical: Not on file    Non-medical: Not on file  Tobacco Use  . Smoking status: Current Every Day Smoker  . Smokeless tobacco: Current User  Substance and Sexual Activity  . Alcohol use: Yes    Alcohol/week: 0.0 oz  . Drug use: No  . Sexual activity: Yes  Lifestyle  . Physical activity:    Days per week: Not on file    Minutes per session: Not on file  . Stress: Not on file  Relationships  . Social connections:    Talks on phone: Not on file    Gets together: Not on file    Attends religious service: Not on file    Active member of club or organization: Not on file    Attends meetings of clubs or organizations: Not on file    Relationship status: Not on file  . Intimate partner violence:    Fear of current or ex partner: Not on file    Emotionally abused: Not on file    Physically abused: Not  on file    Forced sexual activity: Not on file  Other Topics Concern  . Not on file  Social History Narrative  . Not on file    Allergies  Allergen Reactions  . Morphine     Other reaction(s): VOMITING  . Shellfish-Derived Products     Other reaction(s): SWELLING Other reaction(s): SWELLING    Outpatient Medications Prior to Visit  Medication Sig Dispense Refill  . clorazepate (TRANXENE) 7.5 MG tablet Take 1 tablet (7.5 mg total) by mouth at bedtime. #30 to last 90 days/ approx 10/17/17 30 tablet 0  . acyclovir (ZOVIRAX) 200 MG capsule Take 1 capsule (200 mg total) by mouth 3 (three) times daily. 30 capsule 0  . predniSONE (DELTASONE) 10 MG tablet Take 1  tablet (10 mg total) by mouth daily with breakfast. 30 tablet 0   No facility-administered medications prior to visit.     Review of Systems  Constitutional: Negative for chills, fatigue, fever, irritability, malaise/fatigue and weight loss.  HENT: Negative for ear discharge, ear pain and sore throat.   Eyes: Negative for blurred vision.  Respiratory: Negative for cough, sputum production, shortness of breath and wheezing.   Cardiovascular: Negative for chest pain, palpitations and leg swelling.  Gastrointestinal: Negative for abdominal pain, blood in stool, constipation, diarrhea, heartburn, melena and nausea.  Genitourinary: Negative for dysuria, frequency, hematuria, impotence and urgency.  Musculoskeletal: Negative for back pain, joint pain, myalgias and neck pain.  Skin: Negative for rash.  Neurological: Negative for dizziness, tingling, sensory change, focal weakness and headaches.  Endo/Heme/Allergies: Negative for environmental allergies and polydipsia. Does not bruise/bleed easily.  Psychiatric/Behavioral: Negative for confusion, decreased concentration, depression and suicidal ideas. The patient is nervous/anxious and has insomnia.      Objective  Vitals:   01/02/18 0926  BP: 130/80  Pulse: 64  Weight: 176 lb (79.8 kg)  Height: 5\' 7"  (1.702 m)    Physical Exam  Constitutional: No distress.  HENT:  Head: Normocephalic and atraumatic.  Right Ear: External ear normal.  Left Ear: External ear normal.  Nose: Nose normal.  Mouth/Throat: Oropharynx is clear and moist.  Eyes: Pupils are equal, round, and reactive to light. Conjunctivae and EOM are normal. Right eye exhibits no discharge. Left eye exhibits no discharge.  Neck: Normal range of motion. Neck supple. No JVD present. No thyromegaly present.  Cardiovascular: Normal rate, regular rhythm, normal heart sounds and intact distal pulses. Exam reveals no gallop and no friction rub.  No murmur heard. Pulmonary/Chest:  Effort normal and breath sounds normal.  Abdominal: Soft. Bowel sounds are normal. She exhibits no mass. There is no tenderness. There is no guarding.  Musculoskeletal: Normal range of motion. She exhibits no edema.  Lymphadenopathy:    She has no cervical adenopathy.  Neurological: She is alert. She has normal reflexes.  Skin: Skin is warm and dry. She is not diaphoretic.  Nursing note and vitals reviewed.     Assessment & Plan  Problem List Items Addressed This Visit      Other   Cannot sleep   Relevant Medications   clorazepate (TRANXENE) 7.5 MG tablet    Other Visit Diagnoses    Cigarette nicotine dependence without complication    -  Primary   ready to quit ,desires patches   Relevant Medications   nicotine (NICODERM CQ) 21 mg/24hr patch   Motion sickness, subsequent encounter       needs scopolomine patch for cruise   Need for Tdap vaccination  Relevant Orders   Tdap vaccine greater than or equal to 7yo IM (Completed)      Meds ordered this encounter  Medications  . clorazepate (TRANXENE) 7.5 MG tablet    Sig: Take 1 tablet (7.5 mg total) by mouth at bedtime. #30 to last 90 days/ approx 10/17/17    Dispense:  30 tablet    Refill:  3    Must last 90 days- exp 01/14/18  . nicotine (NICODERM CQ) 21 mg/24hr patch    Sig: Place 1 patch (21 mg total) onto the skin daily.    Dispense:  28 patch    Refill:  0  . scopolamine (TRANSDERM-SCOP) 1 MG/3DAYS    Sig: Place 1 patch (1.5 mg total) onto the skin every 3 (three) days.    Dispense:  10 patch    Refill:  12      Dr. Mac[facility]  01/02/18

## 2018-01-24 IMAGING — CT CT ABD-PELV W/ CM
2 of 5 series · 15 of 46 positions shown, 17 images · IV contrast (APPLIED)
Comparison: CT of the abdomen and pelvis performed 09/05/2016

CLINICAL DATA: Acute onset of right flank and right lower quadrant
abdominal pain. Initial encounter.

EXAM:
CT ABDOMEN AND PELVIS WITH CONTRAST
TECHNIQUE: Multidetector CT imaging of the abdomen and pelvis was performed
using the standard protocol following bolus administration of
intravenous contrast.
CONTRAST:  100mL 2ZEE4F-BNN IOPAMIDOL ([id number]-BNN) INJECTION 61%

[Series 2: routine abd/pel with · axial · 0.77mm/px · z∈[-465,-45]mm · 12 of 94 slices shown, 14 images]
[im 5/94  soft-tissue]
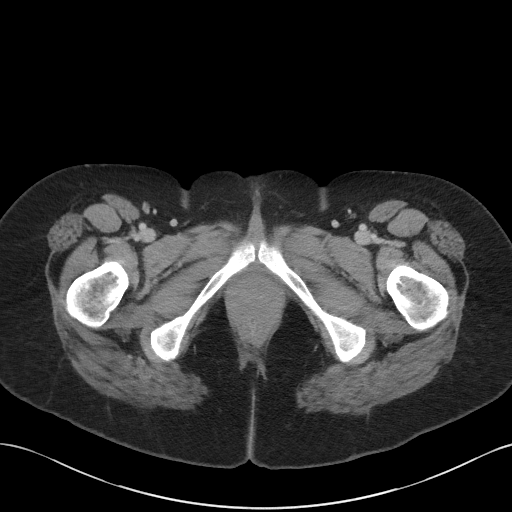
[im 5/94  bone]
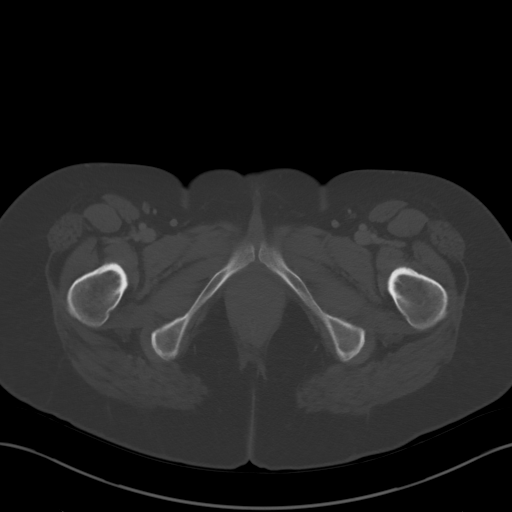
[im 15/94  soft-tissue]
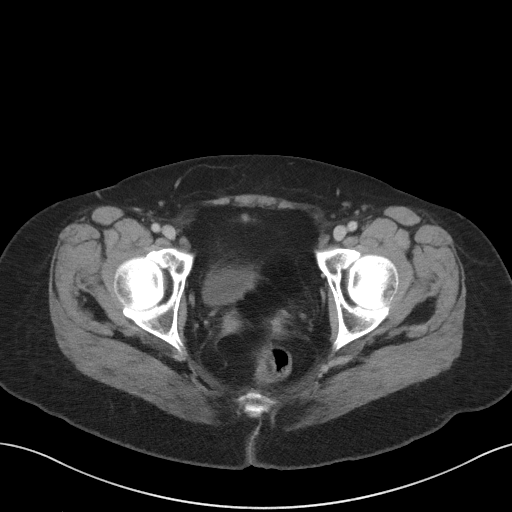
[im 20/94  soft-tissue]
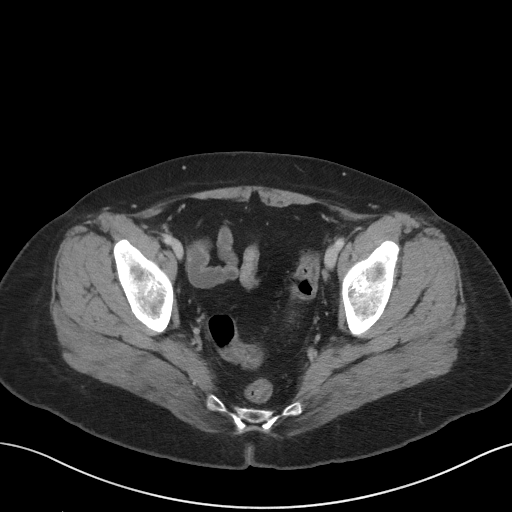
[im 30/94  soft-tissue]
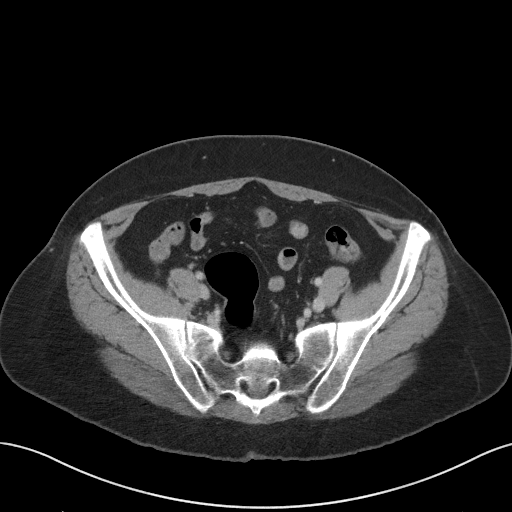
[im 35/94  soft-tissue]
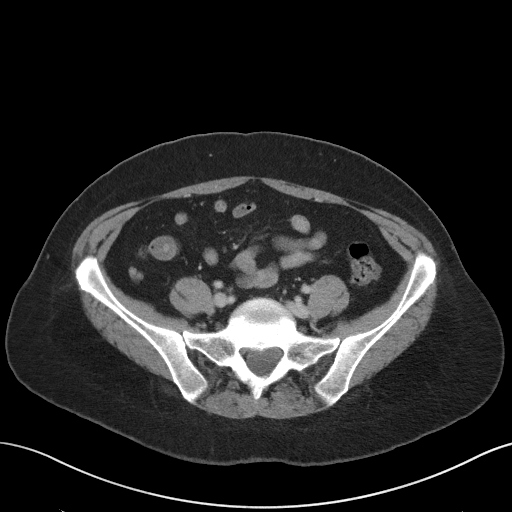
[im 45/94  soft-tissue]
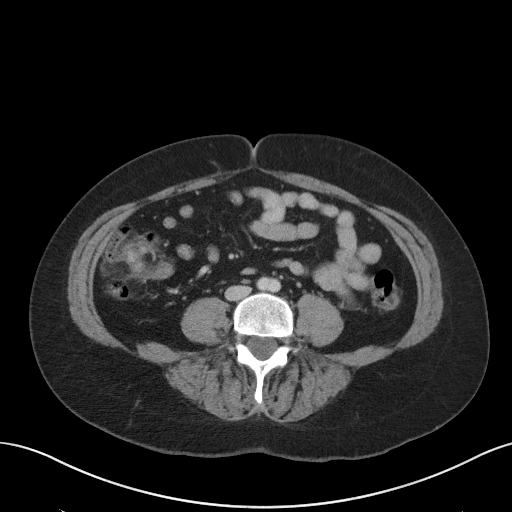
[im 49/94  soft-tissue]
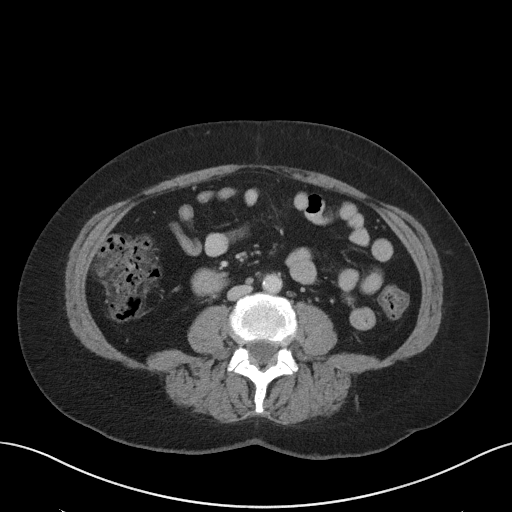
[im 59/94  soft-tissue]
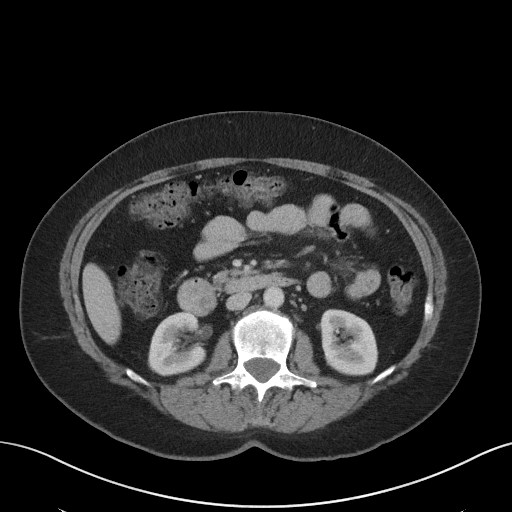
[im 64/94  soft-tissue]
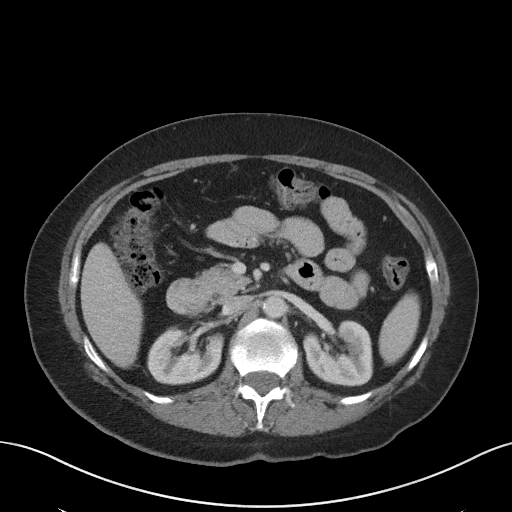
[im 64/94  bone]
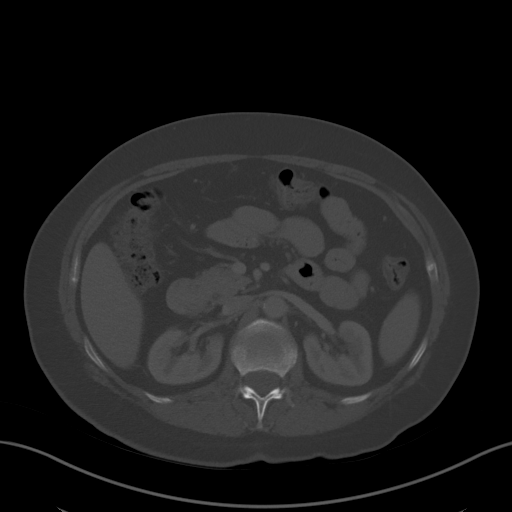
[im 74/94  soft-tissue]
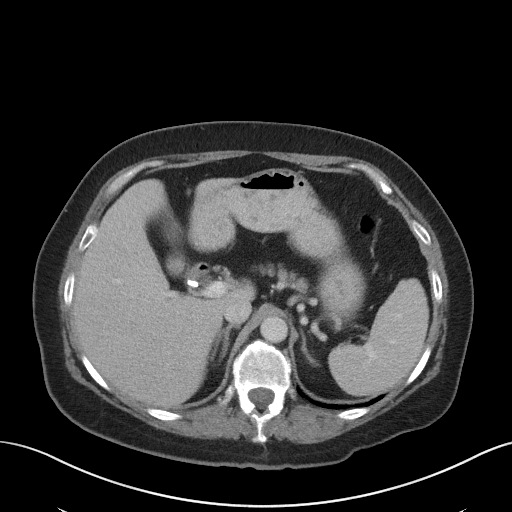
[im 79/94  soft-tissue]
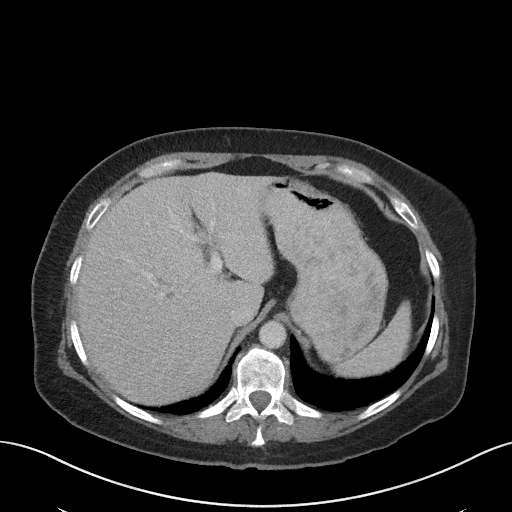
[im 89/94  soft-tissue]
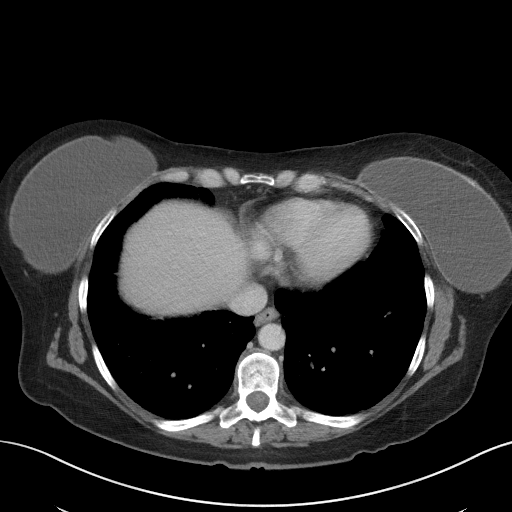

[Series 5: coronal st · coronal · 0.68mm/px · 3 of 90 slices shown]
[im 30/90  soft-tissue]
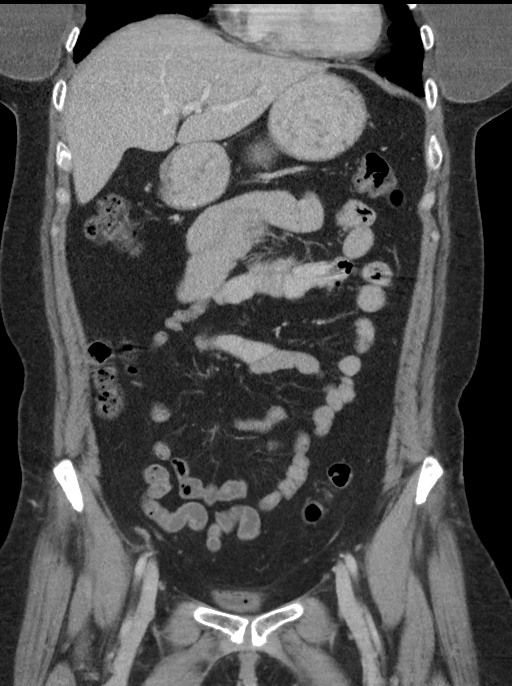
[im 40/90  soft-tissue]
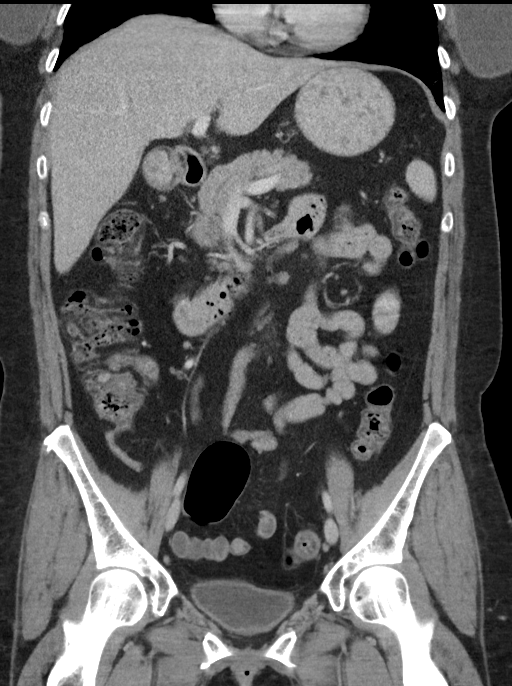
[im 50/90  soft-tissue]
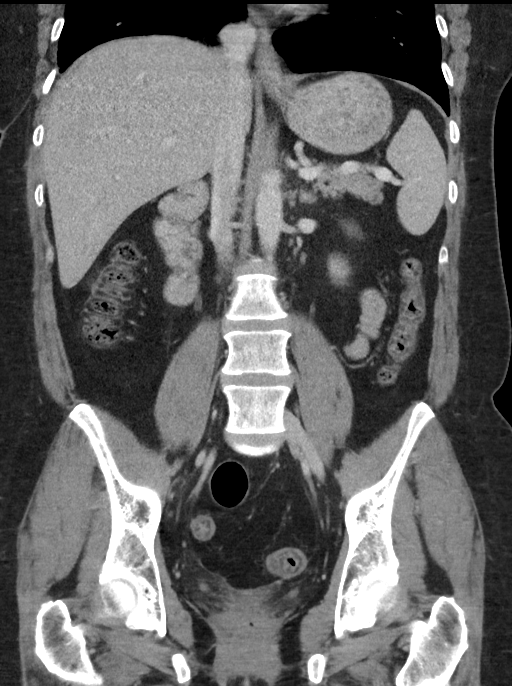

[15 of 46 positions shown; findings below may reference images not displayed]

FINDINGS: Lower chest: Mild bibasilar emphysema is noted. The visualized
portions of the mediastinum are unremarkable. The visualized
portions of the breast implants are grossly unremarkable.

Hepatobiliary: The liver is unremarkable in appearance. The patient
is status post cholecystectomy, with clips noted at the gallbladder
fossa. The common bile duct remains normal in caliber.

Pancreas: The pancreas is within normal limits.

Spleen: The spleen is unremarkable in appearance.

Adrenals/Urinary Tract: The adrenal glands are unremarkable in
appearance. The kidneys are within normal limits. There is no
evidence of hydronephrosis. No renal or ureteral stones are
identified. No perinephric stranding is seen.

Stomach/Bowel: The stomach is unremarkable in appearance. The small
bowel is within normal limits. The appendix is normal in caliber,
without evidence of appendicitis.

Vague soft tissue inflammation and mild mucosal edema is noted along
the ascending and transverse colon, with mild fatty infiltration of
the wall of the colon, raising concern for an infectious or
inflammatory process.

Vascular/Lymphatic: Scattered calcification is seen along the
abdominal aorta and its branches. The abdominal aorta is otherwise
grossly unremarkable. The inferior vena cava is grossly
unremarkable. No retroperitoneal lymphadenopathy is seen. No pelvic
sidewall lymphadenopathy is identified.

Reproductive: The bladder is mildly distended and grossly
unremarkable. The patient is status post hysterectomy. No suspicious
adnexal masses are seen.

Other: No additional soft tissue abnormalities are seen.

Musculoskeletal: No acute osseous abnormalities are identified. The
visualized musculature is unremarkable in appearance.
IMPRESSION: 1. Vague soft tissue inflammation and mild mucosal edema along the
ascending and transverse colon, with mild fatty infiltration of the
wall of the colon, raising concern for an infectious or inflammatory
process.
2. Mild bibasilar emphysema noted.
3. Scattered aortic atherosclerosis noted.

## 2018-03-06 ENCOUNTER — Other Ambulatory Visit: Payer: Self-pay

## 2018-03-06 DIAGNOSIS — F5101 Primary insomnia: Secondary | ICD-10-CM

## 2018-03-06 MED ORDER — CLORAZEPATE DIPOTASSIUM 7.5 MG PO TABS: 7.5000 mg | ORAL_TABLET | Freq: Every evening | ORAL | 0 refills | Status: DC | PRN

## 2018-04-21 ENCOUNTER — Other Ambulatory Visit: Payer: Self-pay

## 2018-04-21 DIAGNOSIS — Z1231 Encounter for screening mammogram for malignant neoplasm of breast: Secondary | ICD-10-CM

## 2018-04-21 DIAGNOSIS — Z1211 Encounter for screening for malignant neoplasm of colon: Secondary | ICD-10-CM

## 2018-04-22 ENCOUNTER — Other Ambulatory Visit: Payer: Self-pay

## 2018-04-22 DIAGNOSIS — Z86010 Personal history of colonic polyps: Secondary | ICD-10-CM

## 2018-05-07 ENCOUNTER — Telehealth: Payer: Self-pay | Admitting: Emergency Medicine

## 2018-05-07 ENCOUNTER — Emergency Department: Payer: BLUE CROSS/BLUE SHIELD

## 2018-05-07 ENCOUNTER — Encounter: Payer: Self-pay | Admitting: Emergency Medicine

## 2018-05-07 ENCOUNTER — Other Ambulatory Visit: Payer: Self-pay

## 2018-05-07 ENCOUNTER — Emergency Department
Admission: EM | Admit: 2018-05-07 | Discharge: 2018-05-07 | Disposition: A | Discharge status: Home / Self Care | Payer: BLUE CROSS/BLUE SHIELD | Attending: Emergency Medicine | Admitting: Emergency Medicine

## 2018-05-07 DIAGNOSIS — Z5321 Procedure and treatment not carried out due to patient leaving prior to being seen by health care provider: Secondary | ICD-10-CM | POA: Insufficient documentation

## 2018-05-07 DIAGNOSIS — R51 Headache: Secondary | ICD-10-CM | POA: Insufficient documentation

## 2018-05-07 DIAGNOSIS — R110 Nausea: Secondary | ICD-10-CM | POA: Insufficient documentation

## 2018-05-07 DIAGNOSIS — R0789 Other chest pain: Secondary | ICD-10-CM | POA: Insufficient documentation

## 2018-05-07 LAB — BASIC METABOLIC PANEL
ANION GAP: 9 (ref 5–15)
BUN: 11 mg/dL (ref 6–20)
CO2: 24 mmol/L (ref 22–32)
Calcium: 9.4 mg/dL (ref 8.9–10.3)
Chloride: 107 mmol/L (ref 98–111)
Creatinine, Ser: 0.86 mg/dL (ref 0.44–1.00)
GFR calc Af Amer: >60 mL/min (ref >60)
Glucose, Bld: 105 mg/dL — ABNORMAL HIGH (ref 70–99)
POTASSIUM: 3.6 mmol/L (ref 3.5–5.1)
SODIUM: 140 mmol/L (ref 135–145)

## 2018-05-07 LAB — CBC
HEMATOCRIT: 39.7 % (ref 35.0–47.0)
HEMOGLOBIN: 13.9 g/dL (ref 12.0–16.0)
MCH: 29.6 pg (ref 26.0–34.0)
MCHC: 34.9 g/dL (ref 32.0–36.0)
MCV: 84.8 fL (ref 80.0–100.0)
Platelets: 243 10*3/uL (ref 150–440)
RBC: 4.69 MIL/uL (ref 3.80–5.20)
RDW: 13.8 % (ref 11.5–14.5)
WBC: 9.7 10*3/uL (ref 3.6–11.0)

## 2018-05-07 LAB — TROPONIN I: Troponin I: <0.03 ng/mL (ref <0.03)

## 2018-05-07 MED ORDER — ACETAMINOPHEN 325 MG PO TABS
ORAL_TABLET | ORAL | Status: AC
  Filled 2018-05-07: qty 2

## 2018-05-07 MED ORDER — ONDANSETRON 4 MG PO TBDP
ORAL_TABLET | ORAL | Status: AC
  Filled 2018-05-07: qty 1

## 2018-05-07 MED ORDER — ACETAMINOPHEN 325 MG PO TABS
650.0000 mg | ORAL_TABLET | Freq: Once | ORAL | Status: AC
  Administered 2018-05-07: 650 mg via ORAL

## 2018-05-07 MED ORDER — ONDANSETRON 4 MG PO TBDP
4.0000 mg | ORAL_TABLET | Freq: Once | ORAL | Status: AC
  Administered 2018-05-07: 4 mg via ORAL

## 2018-05-07 NOTE — ED Notes (Signed)
Pt has not returned to ED lobby 

## 2018-05-07 NOTE — ED Triage Notes (Addendum)
Pt to triage via w/c with no distress noted, brought in by EMS; 324mg  ASA admin enroute; Pt reports awoke PTA with mid CP radiating into left arm accomp by nausea and generalized HA; denies hx of same

## 2018-05-07 NOTE — ED Notes (Addendum)
Pt's visitor to STAT desk to inquire over wait time; informed of such; pt noted leaving ED lobby with family and getting into vehicle

## 2018-05-07 NOTE — Telephone Encounter (Signed)
Called patient due to lwot to inquire about condition and follow up plans. Left message.   

## 2018-05-09 ENCOUNTER — Encounter: Payer: Self-pay | Admitting: Family Medicine

## 2018-05-09 ENCOUNTER — Encounter: Payer: Self-pay | Admitting: Emergency Medicine

## 2018-05-09 ENCOUNTER — Other Ambulatory Visit: Payer: Self-pay

## 2018-05-09 ENCOUNTER — Ambulatory Visit (INDEPENDENT_AMBULATORY_CARE_PROVIDER_SITE_OTHER): Payer: BLUE CROSS/BLUE SHIELD | Admitting: Family Medicine

## 2018-05-09 ENCOUNTER — Emergency Department: Payer: BLUE CROSS/BLUE SHIELD

## 2018-05-09 ENCOUNTER — Emergency Department
Admission: EM | Admit: 2018-05-09 | Discharge: 2018-05-09 | Disposition: A | Discharge status: Home / Self Care | Payer: BLUE CROSS/BLUE SHIELD | Attending: Emergency Medicine | Admitting: Emergency Medicine

## 2018-05-09 VITALS — BP 120/98 | HR 84 | Ht 67.0 in | Wt 174.0 lb

## 2018-05-09 DIAGNOSIS — R079 Chest pain, unspecified: Secondary | ICD-10-CM | POA: Diagnosis not present

## 2018-05-09 DIAGNOSIS — M542 Cervicalgia: Secondary | ICD-10-CM

## 2018-05-09 DIAGNOSIS — R519 Headache, unspecified: Secondary | ICD-10-CM

## 2018-05-09 DIAGNOSIS — R0789 Other chest pain: Secondary | ICD-10-CM | POA: Diagnosis not present

## 2018-05-09 DIAGNOSIS — F17200 Nicotine dependence, unspecified, uncomplicated: Secondary | ICD-10-CM | POA: Insufficient documentation

## 2018-05-09 DIAGNOSIS — R51 Headache: Secondary | ICD-10-CM

## 2018-05-09 LAB — BASIC METABOLIC PANEL
Anion gap: 8 (ref 5–15)
BUN: 9 mg/dL (ref 6–20)
CALCIUM: 9.7 mg/dL (ref 8.9–10.3)
CHLORIDE: 109 mmol/L (ref 98–111)
CO2: 25 mmol/L (ref 22–32)
CREATININE: 0.82 mg/dL (ref 0.44–1.00)
Glucose, Bld: 96 mg/dL (ref 70–99)
Potassium: 3.9 mmol/L (ref 3.5–5.1)
SODIUM: 142 mmol/L (ref 135–145)

## 2018-05-09 LAB — CBC
HCT: 44.1 % (ref 35.0–47.0)
Hemoglobin: 14.9 g/dL (ref 12.0–16.0)
MCH: 29.1 pg (ref 26.0–34.0)
MCHC: 33.8 g/dL (ref 32.0–36.0)
MCV: 86.1 fL (ref 80.0–100.0)
PLATELETS: 233 10*3/uL (ref 150–440)
RBC: 5.13 MIL/uL (ref 3.80–5.20)
RDW: 13.5 % (ref 11.5–14.5)
WBC: 11.9 10*3/uL — AB (ref 3.6–11.0)

## 2018-05-09 LAB — TROPONIN I

## 2018-05-09 MED ORDER — SODIUM CHLORIDE 0.9 % IV BOLUS: 500.0000 mL | Freq: Once | INTRAVENOUS | Status: DC

## 2018-05-09 MED ORDER — KETOROLAC TROMETHAMINE 30 MG/ML IJ SOLN
30.0000 mg | Freq: Once | INTRAMUSCULAR | Status: AC
  Administered 2018-05-09: 30 mg via INTRAVENOUS
  Filled 2018-05-09: qty 1

## 2018-05-09 MED ORDER — METOCLOPRAMIDE HCL 10 MG PO TABS: 10.0000 mg | ORAL_TABLET | Freq: Three times a day (TID) | ORAL | 0 refills | Status: DC | PRN

## 2018-05-09 MED ORDER — ONDANSETRON 4 MG PO TBDP
4.0000 mg | ORAL_TABLET | Freq: Once | ORAL | Status: AC | PRN
  Administered 2018-05-09: 4 mg via ORAL

## 2018-05-09 MED ORDER — KETOROLAC TROMETHAMINE 30 MG/ML IJ SOLN
30.0000 mg | Freq: Once | INTRAMUSCULAR | Status: AC
  Administered 2018-05-09: 30 mg via INTRAMUSCULAR

## 2018-05-09 MED ORDER — ONDANSETRON 4 MG PO TBDP
ORAL_TABLET | ORAL | Status: AC
  Filled 2018-05-09: qty 1

## 2018-05-09 MED ORDER — DEXAMETHASONE SODIUM PHOSPHATE 10 MG/ML IJ SOLN
10.0000 mg | Freq: Once | INTRAMUSCULAR | Status: AC
  Administered 2018-05-09: 10 mg via INTRAVENOUS
  Filled 2018-05-09: qty 1

## 2018-05-09 MED ORDER — METOCLOPRAMIDE HCL 5 MG/ML IJ SOLN
10.0000 mg | Freq: Once | INTRAMUSCULAR | Status: AC
  Administered 2018-05-09: 10 mg via INTRAVENOUS
  Filled 2018-05-09: qty 2

## 2018-05-09 MED ORDER — DIPHENHYDRAMINE HCL 50 MG/ML IJ SOLN
25.0000 mg | Freq: Once | INTRAMUSCULAR | Status: AC
  Administered 2018-05-09: 25 mg via INTRAVENOUS
  Filled 2018-05-09: qty 1

## 2018-05-09 NOTE — Discharge Instructions (Signed)
Take Tylenol, ibuprofen, or Excedrin over-the-counter for the headache as well as the Reglan that we prescribed.  Follow-up with Dr. Ronnald Ramp, as well as with your cardiologist Dr. Nehemiah Massed.  Return to the ER for new, worsening, persistent severe headache or chest pain, any weakness or strokelike symptoms, or any other new or worsening symptoms that concern you.

## 2018-05-09 NOTE — ED Triage Notes (Addendum)
Pt presents to ED via POV with c/o CP that radiates into the L arm, with nausea and also c/o migraine HA. Pt states given shot for migraine by PCP who sent her to the ED. Pt also c/o photosensitivity and nausea. Pt states was seen 2 days ago with same symptoms however LWBS and went to see PCP earlier today who referred her here. Pt also states pain reproduce able with palpation to center chest.

## 2018-05-09 NOTE — ED Provider Notes (Signed)
[facility] Emergency Department Provider Note ____________________________________________   First MD Initiated Contact with Patient 05/09/18 1504     (approximate)  I have reviewed the triage vital signs and the nursing notes.   HISTORY  Chief Complaint Chest Pain    HPI Shelby Gallagher is a 57 y.o. female with PMH as noted below who presents with chest pain and headache.  The patient reports that the pain is substernal and radiating to the left side of her neck and is been occurring over the last several days.  It is worse with certain positions, and not associated with shortness of breath.  The patient also reports headache bilaterally to the top of her head, described as throbbing, and associated with nausea and photophobia.  She does not have any prior history of similar headaches.  She denies any trauma.  The patient initially came to the hospital a few days ago but the wait was too long so she went home.  She then went to her doctor today and got a shot of Toradol which helped, but was referred to the ER for cardiac work-up and possible CT head.  Past Medical History:  Diagnosis Date  . Anxiety     Patient Active Problem List   Diagnosis Date Noted  . Current tobacco use 08/23/2014  . Cannot sleep 01/06/2003    Past Surgical History:  Procedure Laterality Date  . CHOLECYSTECTOMY    . COLONOSCOPY  2015   repeat in 3 years- Dr Janalyn Rouse- [place]  . TONSILLECTOMY    . VAGINAL HYSTERECTOMY      Prior to Admission medications   Medication Sig Start Date End Date Taking? Authorizing Provider  clorazepate (TRANXENE) 7.5 MG tablet Take 1 tablet (7.5 mg total) by mouth at bedtime as needed for anxiety (#30 TO LAST 90 DAYS WITH NO MORE REFILLS). 03/06/18   Juline Patch, MD  metoCLOPramide (REGLAN) 10 MG tablet Take 1 tablet (10 mg total) by mouth every 8 (eight) hours as needed for up to 4 days for nausea (or headache). 05/09/18 05/13/18  Arta Silence, MD  nicotine (NICODERM CQ) 21 mg/24hr patch Place 1 patch (21 mg total) onto the skin daily. Patient not taking: Reported on 05/09/2018 01/02/18   Juline Patch, MD    Allergies Morphine and Shellfish-derived products  Family History  Problem Relation Age of Onset  . Cancer Mother   . Hypertension Mother   . Cancer Father   . Hypertension Father   . Stroke Father     Social History Social History   Tobacco Use  . Smoking status: Current Every Day Smoker  . Smokeless tobacco: Current User  Substance Use Topics  . Alcohol use: Yes    Alcohol/week: 0.0 standard drinks  . Drug use: No    Review of Systems  Constitutional: No fever. Eyes: No visual changes.  Positive for photophobia. ENT: Positive for neck pain. Cardiovascular: Positive for chest pain. Respiratory: Denies shortness of breath. Gastrointestinal: Positive for nausea.  Genitourinary: Negative for flank pain.  Musculoskeletal: Negative for back pain. Skin: Negative for rash. Neurological: Positive for headache.  Negative for focal weakness or numbness.   ____________________________________________   PHYSICAL EXAM:  VITAL SIGNS: ED Triage Vitals  Enc Vitals Group     BP 05/09/18 1424 129/79     Pulse Rate 05/09/18 1200 69     Resp 05/09/18 1200 18     Temp 05/09/18 1200 99.5 F (37.5 C)  Temp Source 05/09/18 1200 Oral     SpO2 05/09/18 1200 100 %     Weight 05/09/18 1201 174 lb (78.9 kg)     Height 05/09/18 1201 5\' 7"  (1.702 m)     Head Circumference --      Peak Flow --      Pain Score 05/09/18 1201 9     Pain Loc --      Pain Edu? --      Excl. in [place]? --     Constitutional: Alert and oriented.  Relatively well appearing and in no acute distress. Eyes: Conjunctivae are normal.  EOMI.  PERRLA. Head: Atraumatic. Nose: No congestion/rhinnorhea. Mouth/Throat: Mucous membranes are moist.   Neck: Normal range of motion.  Supple.  No meningeal signs. Cardiovascular: Normal rate,  regular rhythm. Grossly normal heart sounds.  Good peripheral circulation.  Reproducible anterior chest wall tenderness. Respiratory: Normal respiratory effort.  No retractions. Lungs CTAB. Gastrointestinal: No distention.  Musculoskeletal:  Extremities warm and well perfused.  Neurologic:  Normal speech and language.  Normal coordination with no ataxia on finger-to-nose.  Cranial nerves III through XII intact.  Motor and sensory intact in all extremities.  No pronator drift.  No gross focal neurologic deficits are appreciated.  Skin:  Skin is warm and dry. No rash noted. Psychiatric: Mood and affect are normal. Speech and behavior are normal.  ____________________________________________   LABS (all labs ordered are listed, but only abnormal results are displayed)  Labs Reviewed  CBC - Abnormal; Notable for the following components:      Result Value   WBC 11.9 (*)    All other components within normal limits  BASIC METABOLIC PANEL  TROPONIN I  TROPONIN I   ____________________________________________  EKG  ED ECG REPORT I, Arta Silence, the attending physician, personally viewed and interpreted this ECG.  Date: 05/09/2018 EKG Time: 1158 Rate: 71 Rhythm: normal sinus rhythm QRS Axis: normal Intervals: normal ST/T Wave abnormalities: normal Narrative Interpretation: no evidence of acute ischemia  ____________________________________________  RADIOLOGY  CT head: No ICH or other acute abnormality CXR: No focal infiltrate  ____________________________________________   PROCEDURES  Procedure(s) performed: No  Procedures  Critical Care performed: No ____________________________________________   INITIAL IMPRESSION / ASSESSMENT AND PLAN / ED COURSE  Pertinent labs & imaging results that were available during my care of the patient were reviewed by me and considered in my medical decision making (see chart for details).  57 year old female with history of  anxiety presents with atypical chest pain as well as headache associated with nausea and photophobia and some fatigue over the last several days.  The patient was seen by her primary care doctor today and referred to the emergency department for cardiac work-up and possible imaging of her brain.  I reviewed the past medical records in epic and confirmed a note from Dr. Ronnald Ramp at [facility] today confirming this evaluation.  On exam, the patient is slightly uncomfortable but relatively well-appearing.  Neuro exam is nonfocal.  There are no meningeal signs.  Vital signs are normal.  The patient's EKG is nonischemic.  Overall headache is most consistent with migraine, and the chest pain, which has been somewhat recurrent for the patient per prior notes, is consistent with costochondritis or other musculoskeletal etiology.  Initial labs including troponin are negative.  I will obtain a repeat 3-hour troponin, a CT had given the patient's age with no prior migraine history, and give symptomatic treatment for migraine with Reglan and  Benadryl.  ----------------------------------------- 5:03 PM on 05/09/2018 -----------------------------------------  CT head is negative.  Repeat troponin is normal.  The patient reported minimal relief with the Reglan and Benadryl, so added on Decadron and Toradol.  The patient wants to go home.  She feels comfortable and would prefer to rest in her own bed.  She is stable for discharge at this time.  Return precautions given, and she expresses understanding. ____________________________________________   FINAL CLINICAL IMPRESSION(S) / ED DIAGNOSES  Final diagnoses:  Atypical chest pain  Acute nonintractable headache, unspecified headache type      NEW MEDICATIONS STARTED DURING THIS VISIT:  New Prescriptions   METOCLOPRAMIDE (REGLAN) 10 MG TABLET    Take 1 tablet (10 mg total) by mouth every 8 (eight) hours as needed for up to 4 days for nausea (or  headache).     Note:  This document was prepared using Dragon voice recognition software and may include unintentional dictation errors.   Arta Silence, MD 05/09/18 [phone]

## 2018-05-09 NOTE — Progress Notes (Signed)
Date:  05/09/2018   Name:  Shelby Gallagher   DOB:  03/01/1961   MRN:  [id number]   Chief Complaint: Chest Pain ("soreness" that runs up neck) and Headache (with nausea- was seen in ER on Sept 25) Chest Pain   This is a recurrent problem. The current episode started in the past 7 days (9/25). The onset quality is gradual. Progression since onset: chest pain intermitant. The pain is present in the lateral region. The pain is moderate. The quality of the pain is described as pressure (sore). The pain radiates to the left neck and left arm. Associated symptoms include diaphoresis, headaches, nausea and weakness. Pertinent negatives include no abdominal pain, back pain, claudication, cough, dizziness, exertional chest pressure, fever, hemoptysis, irregular heartbeat, leg pain, numbness, orthopnea, palpitations, PND, shortness of breath, sputum production, syncope or vomiting.  Headache   This is a new problem. The current episode started in the past 7 days (2 days ago/first episode). The problem has been unchanged. Pain location: "whole top of head" The pain radiates to the left arm. The quality of the pain is described as aching. Associated symptoms include nausea, neck pain, photophobia and weakness. Pertinent negatives include no abdominal pain, back pain, blurred vision, coughing, dizziness, ear pain, eye pain, eye redness, eye watering, fever, numbness, rhinorrhea, sinus pressure, sore throat, visual change or vomiting. The symptoms are aggravated by bright light. She has tried acetaminophen for the symptoms. The treatment provided no relief. Her past medical history is significant for migraines in the family. There is no history of migraine headaches or recent head traumas.  Neck Pain   This is a new problem. The current episode started in the past 7 days (9/25). The problem occurs constantly. The quality of the pain is described as aching. The pain is moderate. Associated symptoms include chest pain,  headaches, photophobia and weakness. Pertinent negatives include no fever, leg pain, numbness, syncope, trouble swallowing or visual change. Associated symptoms comments: Speech measure.     Review of Systems  Constitutional: Positive for diaphoresis. Negative for chills, fatigue, fever and unexpected weight change.  HENT: Negative for congestion, ear discharge, ear pain, rhinorrhea, sinus pressure, sneezing, sore throat and trouble swallowing.   Eyes: Positive for photophobia. Negative for blurred vision, pain, discharge, redness and itching.  Respiratory: Negative for cough, hemoptysis, sputum production, shortness of breath, wheezing and stridor.   Cardiovascular: Positive for chest pain. Negative for palpitations, orthopnea, claudication, syncope and PND.  Gastrointestinal: Positive for nausea. Negative for abdominal pain, blood in stool, constipation, diarrhea and vomiting.  Endocrine: Negative for cold intolerance, heat intolerance, polydipsia, polyphagia and polyuria.  Genitourinary: Negative for dysuria, flank pain, frequency, hematuria, menstrual problem, pelvic pain, urgency, vaginal bleeding and vaginal discharge.  Musculoskeletal: Positive for neck pain. Negative for arthralgias, back pain and myalgias.  Skin: Negative for rash.  Allergic/Immunologic: Negative for environmental allergies and food allergies.  Neurological: Positive for weakness and headaches. Negative for dizziness, light-headedness and numbness.  Hematological: Negative for adenopathy. Does not bruise/bleed easily.  Psychiatric/Behavioral: Negative for dysphoric mood. The patient is not nervous/anxious.     Patient Active Problem List   Diagnosis Date Noted  . Current tobacco use 08/23/2014  . Cannot sleep 01/06/2003    Allergies  Allergen Reactions  . Morphine     Other reaction(s): VOMITING  . Shellfish-Derived Products     Other reaction(s): SWELLING Other reaction(s): SWELLING    Past Surgical  History:  Procedure Laterality Date  .  CHOLECYSTECTOMY    . COLONOSCOPY  2015   repeat in 3 years- Dr Janalyn Rouse- [place]  . TONSILLECTOMY    . VAGINAL HYSTERECTOMY      Social History   Tobacco Use  . Smoking status: Current Every Day Smoker  . Smokeless tobacco: Current User  Substance Use Topics  . Alcohol use: Yes    Alcohol/week: 0.0 standard drinks  . Drug use: No     Medication list has been reviewed and updated.  Current Meds  Medication Sig  . clorazepate (TRANXENE) 7.5 MG tablet Take 1 tablet (7.5 mg total) by mouth at bedtime as needed for anxiety (#30 TO LAST 90 DAYS WITH NO MORE REFILLS).    PHQ 2/9 Scores 01/02/2018 06/14/2017 10/12/2015  PHQ - 2 Score 0 0 0  PHQ- 9 Score 4 4 -    Physical Exam  Constitutional: She is oriented to person, place, and time. She appears well-developed and well-nourished.  HENT:  Head: Normocephalic.  Right Ear: External ear normal.  Left Ear: External ear normal.  Mouth/Throat: Oropharynx is clear and moist.  Eyes: Pupils are equal, round, and reactive to light. Conjunctivae and EOM are normal. Lids are everted and swept, no foreign bodies found. Left eye exhibits no hordeolum. No foreign body present in the left eye. Right conjunctiva is not injected. Left conjunctiva is not injected. No scleral icterus.  Neck: Normal range of motion. Neck supple. No JVD present. No tracheal deviation present. No thyromegaly present.  Cardiovascular: Normal rate, regular rhythm, normal heart sounds and intact distal pulses. Exam reveals no gallop and no friction rub.  No murmur heard. Pulmonary/Chest: Effort normal and breath sounds normal. No respiratory distress. She has no wheezes. She has no rales. She exhibits tenderness and bony tenderness.    Abdominal: Soft. Bowel sounds are normal. She exhibits no mass. There is no hepatosplenomegaly. There is no tenderness. There is no rebound and no guarding.  Musculoskeletal: Normal range of motion. She  exhibits no edema or tenderness.  Lymphadenopathy:    She has no cervical adenopathy.  Neurological: She is alert and oriented to person, place, and time. She has normal strength. She displays normal reflexes. A cranial nerve deficit is present.  Reflex Scores:      Tricep reflexes are 2+ on the right side and 2+ on the left side.      Bicep reflexes are 2+ on the right side and 2+ on the left side.      Brachioradialis reflexes are 2+ on the right side and 2+ on the left side.      Patellar reflexes are 2+ on the right side and 2+ on the left side.      Achilles reflexes are 2+ on the right side and 2+ on the left side. Left ptosis/left facial weakness/ speech abnormal(measured)  Skin: Skin is warm. No rash noted.  Psychiatric: She has a normal mood and affect. Her mood appears not anxious. She does not exhibit a depressed mood.  Nursing note and vitals reviewed.   BP (!) 120/98   Pulse 84   Ht 5\' 7"  (1.702 m)   Wt 174 lb (78.9 kg)   BMI 27.25 kg/m   Assessment and Plan:  There are no diagnoses linked to this encounter. 1. Acute intractable headache, unspecified headache type NEW ONSET/persistent 2 days/ with left ptosis and facial weakness/speech abnormal/left heavy Toradol administered. 2. Chest pain, unspecified type Followed by Dr Nehemiah Massed Ladon Applebaum of costochondral margins. / need eventual stress  test.  3. Neck pain on left side New oset uncertain if radiation or cervical related.  Dr. Mac[facility]  05/09/2018

## 2018-05-09 NOTE — ED Notes (Signed)
EDP at bedside.  Pt states she was at her PCP's office this morning and given a shot of IM Toradol which she states helped with her migraine, but sxs returned about 1 hour ago.  Pt is alert and oriented, answering questions appropriately.

## 2018-05-12 ENCOUNTER — Other Ambulatory Visit: Payer: Self-pay | Admitting: Family Medicine

## 2018-05-12 ENCOUNTER — Telehealth: Payer: Self-pay

## 2018-05-12 ENCOUNTER — Other Ambulatory Visit: Payer: Self-pay

## 2018-05-12 DIAGNOSIS — R519 Headache, unspecified: Secondary | ICD-10-CM

## 2018-05-12 DIAGNOSIS — Z86010 Personal history of colonic polyps: Secondary | ICD-10-CM

## 2018-05-12 DIAGNOSIS — R51 Headache: Principal | ICD-10-CM

## 2018-05-12 NOTE — Progress Notes (Unsigned)
Send to neuro for new onset headaches

## 2018-05-12 NOTE — Telephone Encounter (Signed)
Patient contacted office to reschedule her colonoscopy from 05/14/18 with Dr. Vicente Males due to medical issues.  She requested to be rescheduled with Dr. Allen Norris in [place] on 06/19/18.  Thanks [employer]

## 2018-05-14 ENCOUNTER — Ambulatory Visit
Admission: RE | Admit: 2018-05-14 | Payer: BLUE CROSS/BLUE SHIELD | Source: Ambulatory Visit | Admitting: Gastroenterology

## 2018-05-14 ENCOUNTER — Encounter: Admission: RE | Payer: Self-pay | Source: Ambulatory Visit

## 2018-05-14 SURGERY — COLONOSCOPY WITH PROPOFOL
Anesthesia: General

## 2018-06-12 ENCOUNTER — Telehealth: Payer: Self-pay | Admitting: Gastroenterology

## 2018-06-12 NOTE — Telephone Encounter (Signed)
Pt has rescheduled colonoscopy from 06/19/18 to 07/17/18.

## 2018-06-12 NOTE — Telephone Encounter (Signed)
Pt  Is calling to reschedule procedure 06/19/18 to  Nov.28th because she is having a stress test done

## 2018-07-03 DIAGNOSIS — E782 Mixed hyperlipidemia: Secondary | ICD-10-CM | POA: Insufficient documentation

## 2018-07-17 ENCOUNTER — Ambulatory Visit
Admission: RE | Admit: 2018-07-17 | Payer: BLUE CROSS/BLUE SHIELD | Source: Ambulatory Visit | Admitting: Gastroenterology

## 2018-07-17 ENCOUNTER — Encounter: Admission: RE | Payer: Self-pay | Source: Ambulatory Visit

## 2018-07-17 SURGERY — COLONOSCOPY WITH PROPOFOL
Anesthesia: Choice

## 2019-09-22 IMAGING — CR DG CHEST 2V
1 series · 2 of 2 positions shown · non-contrast
Comparison: 05/07/2018.

CLINICAL DATA: Chest pressure.

EXAM:
CHEST - 2 VIEW

[Series 1: dg chest 2 view · 0.14mm/px · 2 of 2 slices shown]
[im 1/2]
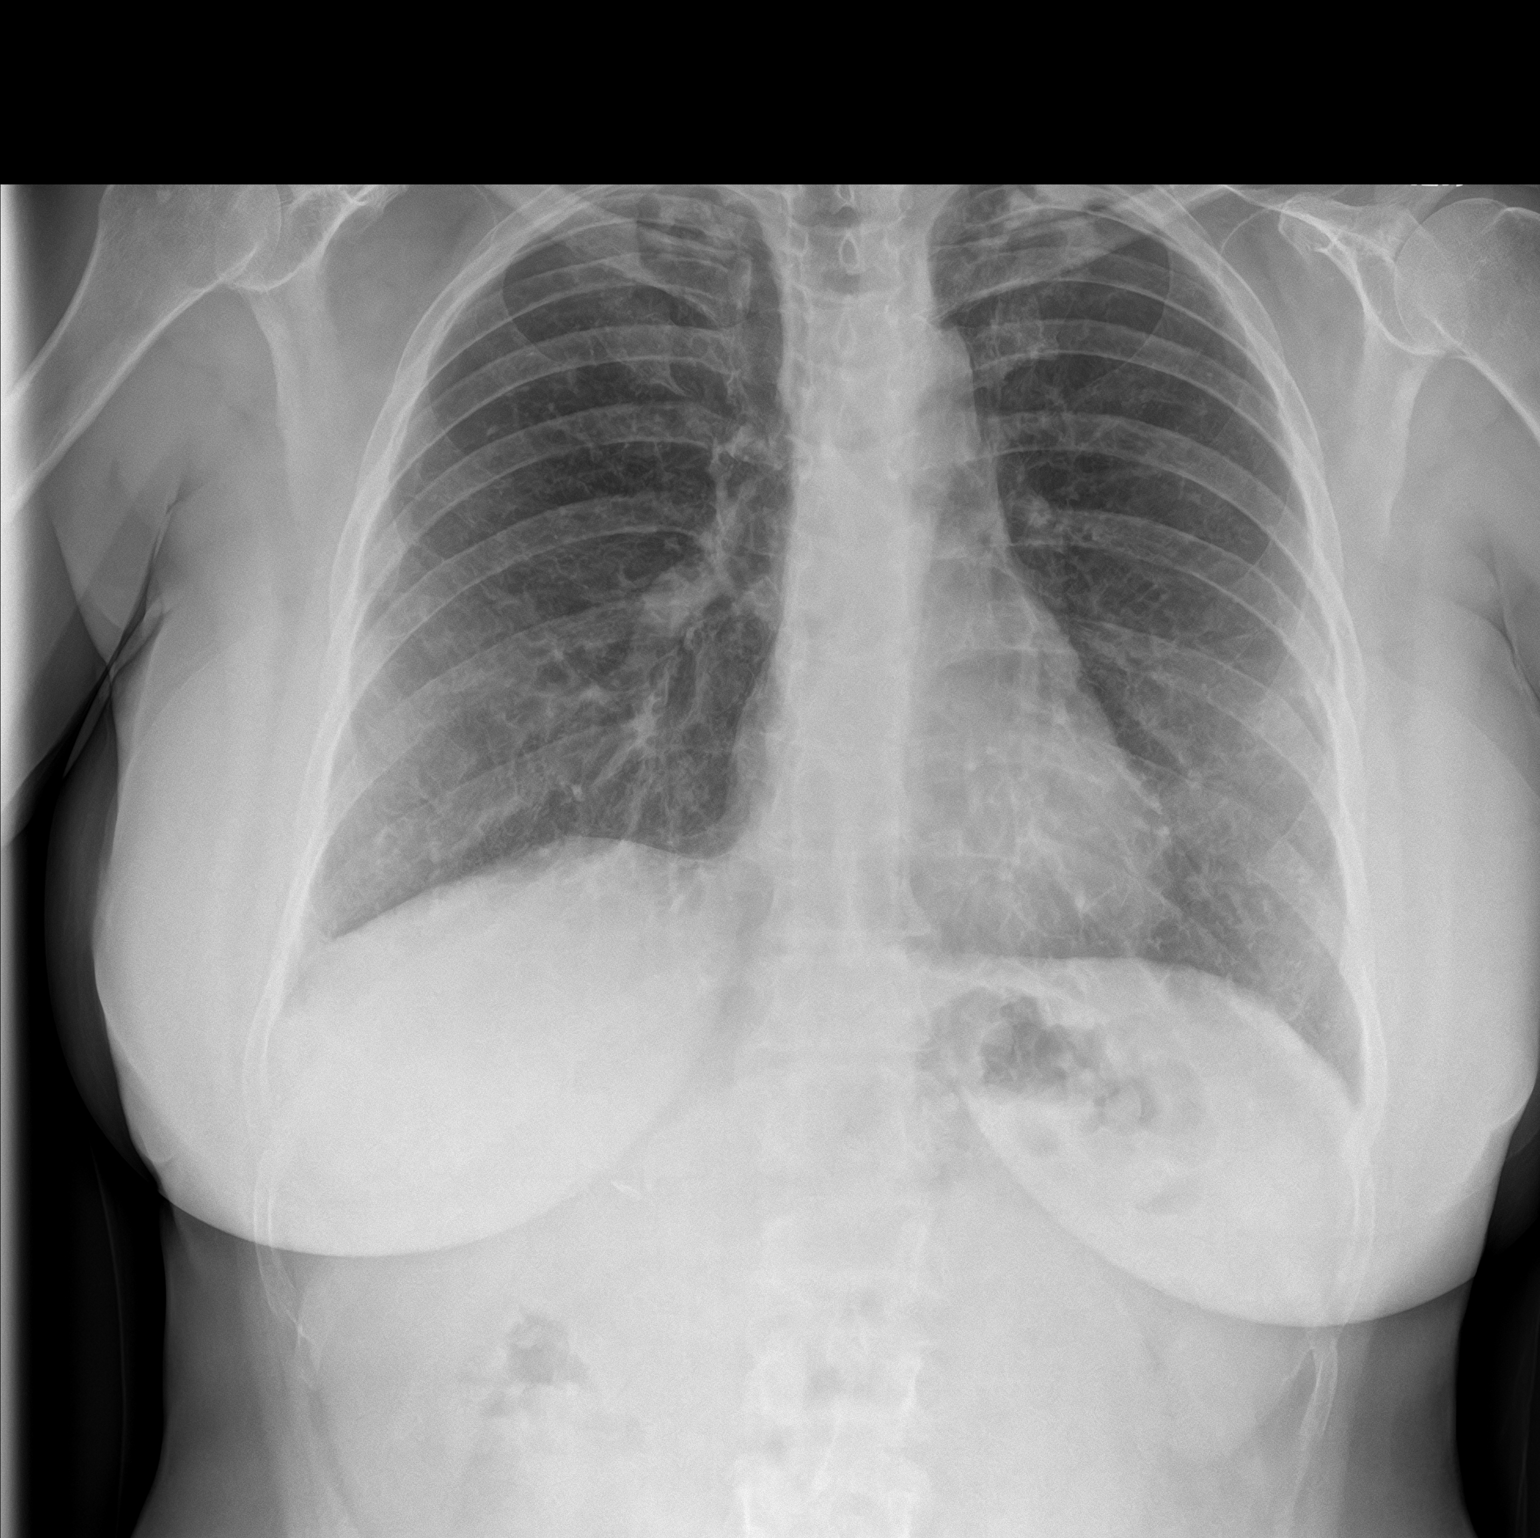
[im 2/2]
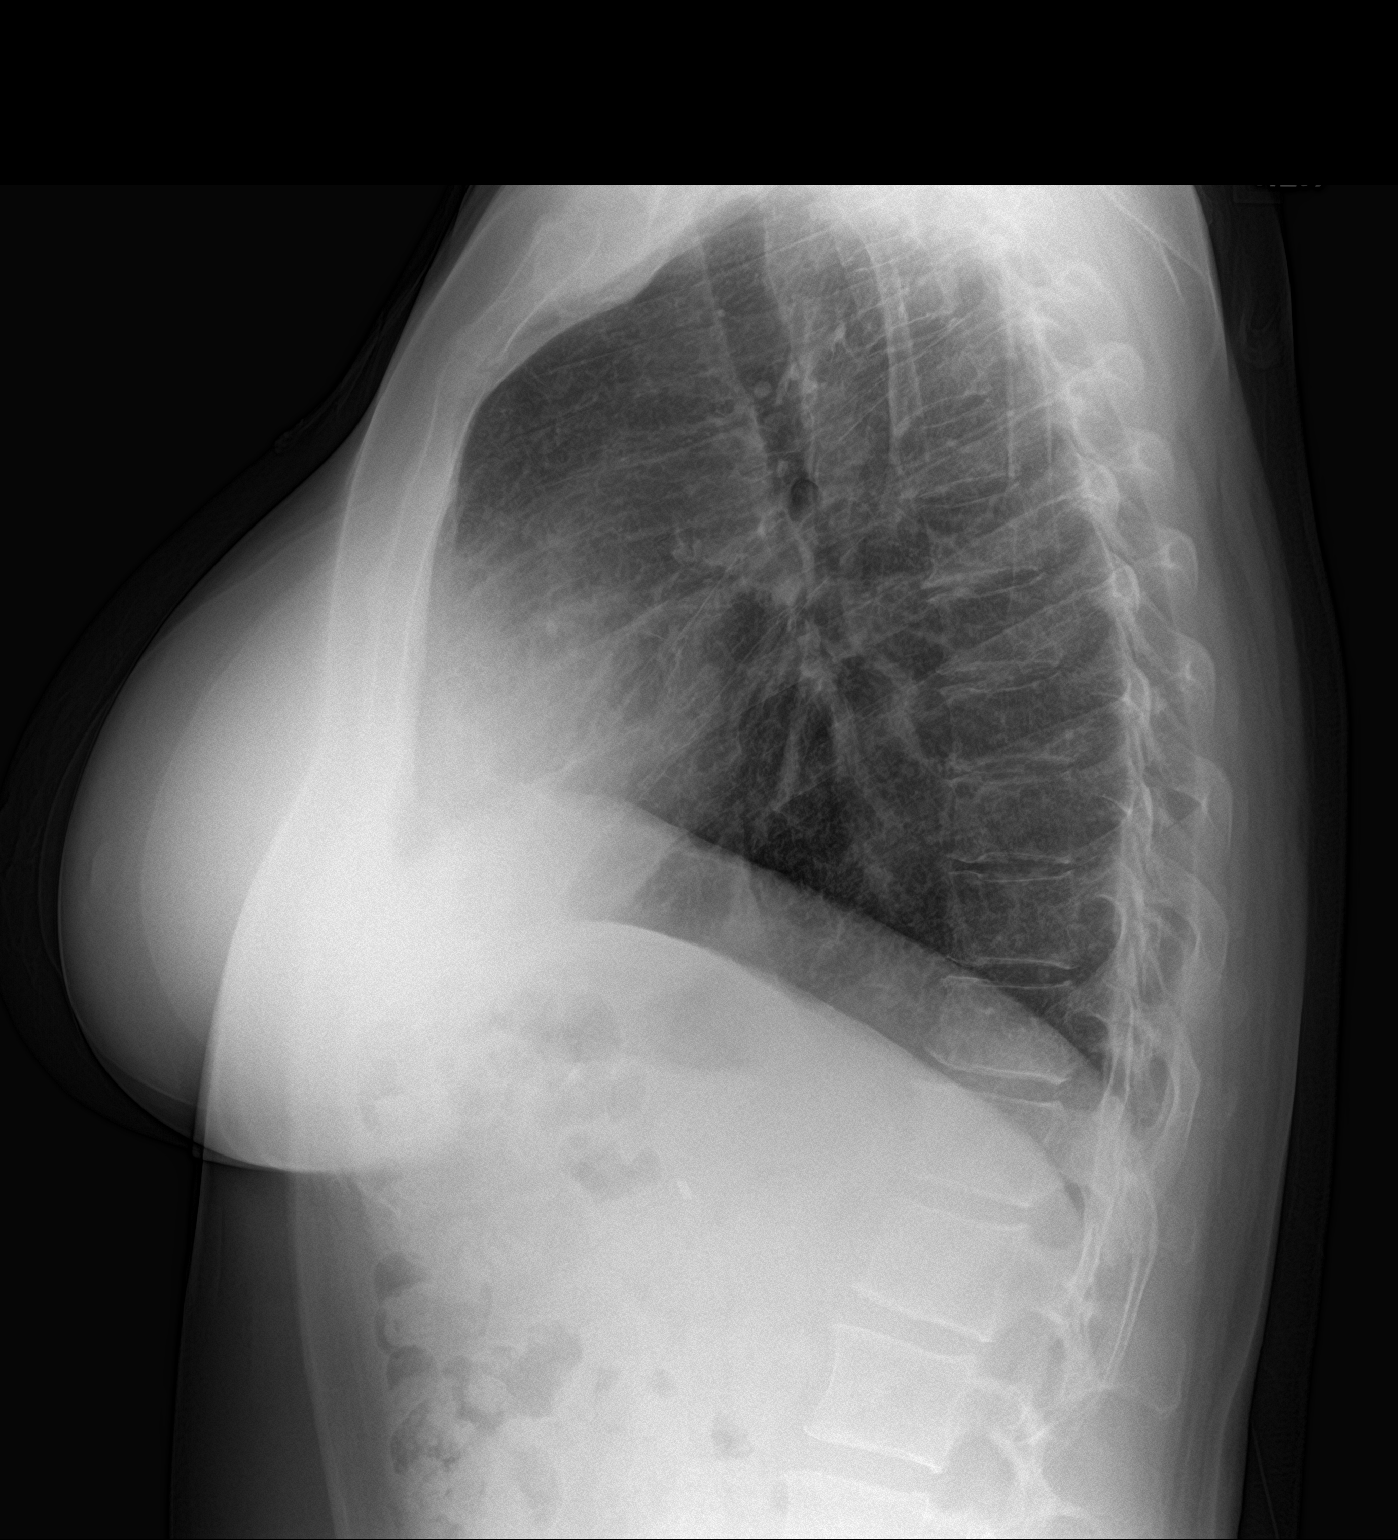

[2 of 2 positions shown; findings below may reference images not displayed]

FINDINGS: Mediastinum and hilar structures normal. Lungs are clear. No pleural
effusion or pneumothorax. Biapical pleural-parenchymal thickening
consistent scarring. Surgical clips right upper quadrant.
IMPRESSION: No acute cardiopulmonary disease. Biapical pleural-parenchymal
thickening consistent with scarring.

## 2020-04-06 ENCOUNTER — Encounter: Payer: BLUE CROSS/BLUE SHIELD | Admitting: Family Medicine

## 2020-09-16 ENCOUNTER — Encounter: Payer: Self-pay | Admitting: Family Medicine

## 2020-09-16 ENCOUNTER — Ambulatory Visit (INDEPENDENT_AMBULATORY_CARE_PROVIDER_SITE_OTHER): Payer: 59 | Admitting: Family Medicine

## 2020-09-16 ENCOUNTER — Other Ambulatory Visit: Payer: Self-pay

## 2020-09-16 VITALS — BP 112/80 | HR 76 | Ht 67.0 in | Wt 170.0 lb

## 2020-09-16 DIAGNOSIS — I999 Unspecified disorder of circulatory system: Secondary | ICD-10-CM | POA: Diagnosis not present

## 2020-09-16 DIAGNOSIS — M7711 Lateral epicondylitis, right elbow: Secondary | ICD-10-CM

## 2020-09-16 DIAGNOSIS — M79605 Pain in left leg: Secondary | ICD-10-CM

## 2020-09-16 DIAGNOSIS — M545 Low back pain, unspecified: Secondary | ICD-10-CM | POA: Diagnosis not present

## 2020-09-16 DIAGNOSIS — M79672 Pain in left foot: Secondary | ICD-10-CM

## 2020-09-16 DIAGNOSIS — M7712 Lateral epicondylitis, left elbow: Secondary | ICD-10-CM

## 2020-09-16 DIAGNOSIS — M79671 Pain in right foot: Secondary | ICD-10-CM

## 2020-09-16 LAB — POCT URINALYSIS DIPSTICK
Bilirubin, UA: NEGATIVE
Blood, UA: NEGATIVE
Glucose, UA: NEGATIVE
Ketones, UA: NEGATIVE
Leukocytes, UA: NEGATIVE
Nitrite, UA: NEGATIVE
Protein, UA: NEGATIVE
Spec Grav, UA: 1.015 (ref 1.010–1.025)
Urobilinogen, UA: 0.2 E.U./dL
pH, UA: 5 (ref 5.0–8.0)

## 2020-09-16 MED ORDER — MELOXICAM 15 MG PO TABS: 15.0000 mg | ORAL_TABLET | Freq: Every day | ORAL | 0 refills | Status: DC

## 2020-09-16 NOTE — Progress Notes (Signed)
Date:  09/16/2020   Name:  Shelby Gallagher   DOB:  [birth year]   MRN:  [id number]   Chief Complaint: Hip Pain (Radiating down L) leg. Now left foot pain/ heel pain- burning and electrical pain) and Elbow Pain  Back Pain This is a chronic problem. The current episode started more than 1 month ago. The problem occurs intermittently. The pain is present in the lumbar spine and gluteal. The quality of the pain is described as aching. The pain radiates to the left thigh and left knee. The pain is moderate. The symptoms are aggravated by twisting. Associated symptoms include leg pain. Pertinent negatives include no abdominal pain, bladder incontinence, bowel incontinence, dysuria, fever, headaches, numbness, paresthesias, pelvic pain or weakness.  Leg Pain  Incident onset: onset 2 weeks. There was no injury mechanism. The pain is present in the left foot and right foot. The quality of the pain is described as aching. The pain is at a severity of 9/10. The pain is moderate. Pertinent negatives include no numbness. The symptoms are aggravated by movement and weight bearing. She has tried NSAIDs for the symptoms. The treatment provided no relief.  Arm Pain  There was no injury mechanism. The pain is present in the left elbow and right elbow. The quality of the pain is described as aching. The pain is moderate. Pertinent negatives include no numbness.    Lab Results  Component Value Date   CREATININE 0.82 05/09/2018   BUN 9 05/09/2018   NA 142 05/09/2018   K 3.9 05/09/2018   CL 109 05/09/2018   CO2 25 05/09/2018   No results found for: CHOL, HDL, LDLCALC, LDLDIRECT, TRIG, CHOLHDL No results found for: TSH No results found for: HGBA1C Lab Results  Component Value Date   WBC 11.9 (H) 05/09/2018   HGB 14.9 05/09/2018   HCT 44.1 05/09/2018   MCV 86.1 05/09/2018   PLT 233 05/09/2018   Lab Results  Component Value Date   ALT 66 (H) 09/10/2016   AST 68 (H) 09/10/2016   ALKPHOS 189  (H) 09/10/2016   BILITOT 0.8 09/10/2016     Review of Systems  Constitutional: Negative.  Negative for chills, fatigue, fever and unexpected weight change.  HENT: Negative for congestion, ear discharge, ear pain, rhinorrhea, sinus pressure, sneezing and sore throat.   Eyes: Negative for double vision, photophobia, pain, discharge, redness and itching.  Respiratory: Negative for cough, shortness of breath, wheezing and stridor.   Gastrointestinal: Negative for abdominal pain, blood in stool, bowel incontinence, constipation, diarrhea, nausea and vomiting.  Endocrine: Negative for cold intolerance, heat intolerance, polydipsia, polyphagia and polyuria.  Genitourinary: Negative for bladder incontinence, dysuria, flank pain, frequency, hematuria, menstrual problem, pelvic pain, urgency, vaginal bleeding and vaginal discharge.  Musculoskeletal: Positive for back pain. Negative for arthralgias and myalgias.  Skin: Negative for rash.  Allergic/Immunologic: Negative for environmental allergies and food allergies.  Neurological: Negative for dizziness, weakness, light-headedness, numbness, headaches and paresthesias.  Hematological: Negative for adenopathy. Does not bruise/bleed easily.  Psychiatric/Behavioral: Negative for dysphoric mood. The patient is not nervous/anxious.     Patient Active Problem List   Diagnosis Date Noted  . Current tobacco use 08/23/2014  . Cannot sleep 01/06/2003    Allergies  Allergen Reactions  . Morphine     Other reaction(s): VOMITING  . Shellfish-Derived Products     Other reaction(s): SWELLING Other reaction(s): SWELLING    Past Surgical History:  Procedure Laterality Date  . CHOLECYSTECTOMY    .  COLONOSCOPY  2015   repeat in 3 years- Dr Janalyn Rouse- [place]  . TONSILLECTOMY    . VAGINAL HYSTERECTOMY      Social History   Tobacco Use  . Smoking status: Current Every Day Smoker  . Smokeless tobacco: Current User  Substance Use Topics  . Alcohol use:  Yes    Alcohol/week: 0.0 standard drinks  . Drug use: No     Medication list has been reviewed and updated.  Current Meds  Medication Sig  . clorazepate (TRANXENE) 7.5 MG tablet Take 1 tablet (7.5 mg total) by mouth at bedtime as needed for anxiety (#30 TO LAST 90 DAYS WITH NO MORE REFILLS).    PHQ 2/9 Scores 09/16/2020 01/02/2018 06/14/2017 10/12/2015  PHQ - 2 Score 0 0 0 0  PHQ- 9 Score - 4 4 -    No flowsheet data found.  BP Readings from Last 3 Encounters:  09/16/20 112/80  05/09/18 116/69  05/09/18 (!) 120/98    Physical Exam Vitals and nursing note reviewed.  Constitutional:      General: She is not in acute distress.    Appearance: She is well-developed and well-nourished. She is not diaphoretic.  HENT:     Head: Normocephalic and atraumatic.     Right Ear: Tympanic membrane, ear canal and external ear normal. There is no impacted cerumen.     Left Ear: Tympanic membrane, ear canal and external ear normal. There is no impacted cerumen.     Nose: Nose normal. No congestion or rhinorrhea.     Mouth/Throat:     Lips: Pink.     Mouth: Oropharynx is clear and moist. Mucous membranes are moist.  Eyes:     General: Lids are everted, no foreign bodies appreciated. No scleral icterus.       Right eye: No discharge.        Left eye: No foreign body, discharge or hordeolum.     Extraocular Movements: EOM normal.     Conjunctiva/sclera: Conjunctivae normal.     Right eye: Right conjunctiva is not injected.     Left eye: Left conjunctiva is not injected.     Pupils: Pupils are equal, round, and reactive to light.  Neck:     Thyroid: No thyroid mass, thyromegaly or thyroid tenderness.     Vascular: No JVD.     Trachea: No tracheal deviation.  Cardiovascular:     Rate and Rhythm: Normal rate and regular rhythm.     Pulses: Intact distal pulses.          Femoral pulses are 1+ on the right side and 1+ on the left side.      Dorsalis pedis pulses are 0 on the right side and 0  on the left side.       Posterior tibial pulses are 0 on the right side and 0 on the left side.     Heart sounds: Normal heart sounds. No murmur heard. No friction rub. No gallop.   Pulmonary:     Effort: Pulmonary effort is normal. No respiratory distress.     Breath sounds: Normal breath sounds. No wheezing or rales.  Abdominal:     General: Bowel sounds are normal.     Palpations: Abdomen is soft. There is no hepatosplenomegaly or mass.     Tenderness: There is no abdominal tenderness. There is no guarding or rebound.  Musculoskeletal:        General: No edema. Normal range of motion.  Right elbow: Tenderness present in lateral epicondyle.     Left elbow: Tenderness present in lateral epicondyle.     Cervical back: Normal range of motion and neck supple.     Lumbar back: Spasms present. Negative right straight leg raise test and negative left straight leg raise test. Scoliosis present.     Right foot: Normal range of motion.     Left foot: Normal range of motion.  Lymphadenopathy:     Cervical: No cervical adenopathy.  Skin:    General: Skin is warm and dry.     Findings: No rash.  Neurological:     Mental Status: She is alert and oriented to person, place, and time.     Cranial Nerves: No cranial nerve deficit.     Sensory: Sensation is intact.     Motor: Motor function is intact.     Deep Tendon Reflexes: Strength normal and reflexes are normal and symmetric. Reflexes normal.  Psychiatric:        Mood and Affect: Mood and affect normal. Mood is not anxious or depressed.     Wt Readings from Last 3 Encounters:  09/16/20 170 lb (77.1 kg)  05/09/18 174 lb (78.9 kg)  05/09/18 174 lb (78.9 kg)    BP 112/80   Pulse 76   Ht 5\' 7"  (1.702 m)   Wt 170 lb (77.1 kg)   BMI 26.63 kg/m   Assessment and Plan:  1. Lumbar pain with radiation down left leg New onset.  Persistent.  Patient has pain in the lower back radiating to the left buttock and down the posterior aspect of  the left leg.  Neurologic exam is normal and straight leg raises are negative.  I suspect that there is some level of lumbar disc disease with resultant radiculopathy.  We will initiate meloxicam 15 mg once a day. - meloxicam (MOBIC) 15 MG tablet; Take 1 tablet (15 mg total) by mouth daily.  Dispense: 30 tablet; Refill: 0  2. Decreased circulation New onset.  Persistent.  Unstable.  Patient began having bilateral foot pain without resolution about 2 weeks ago there is no palpable tenderness but there is decreased palpable pulses at the posterior tibial and dorsalis pedis bilateral.  Given her extensive history of smoking I suspect that there may be some level of peripheral vascular disease upon checking the femoral pulses it was noted that they were decreased as well and there were no bruits detected.  We will refer to vascular surgery for evaluation for PAD. - Ambulatory referral to Vascular Surgery - POCT urinalysis dipstick  3. Pain in both feet new onset.  There is no palpable tenderness of the areas of the foot in patient also has calf pain which may suggest that there is some claudication as well.  This is somewhat concerning that is rather abrupt in appearance and has remained persistent suggesting that there may be a vascular component. - Ambulatory referral to Vascular Surgery  4. Lateral epicondylitis of both elbows New onset.  Persistent.  Uncontrolled.  Patient has bilateral lateral epicondyles tenderness with pain with resisted wrist extension bilateral.  This is consistent with bilateral epicondylitis and we will initiate meloxicam 15 mg once a day. - meloxicam (MOBIC) 15 MG tablet; Take 1 tablet (15 mg total) by mouth daily.  Dispense: 30 tablet; Refill: 0

## 2020-09-16 NOTE — Patient Instructions (Signed)
Peripheral Vascular Disease  Peripheral vascular disease (PVD) is a disease of the blood vessels. PVD may also be called peripheral artery disease (PAD) or poor circulation. PVD is the blocking or hardening of the arteries anywhere within the circulatory system beyond the heart. This can result in a decreased supply of blood to the arms, legs, and internal organs, such as the stomach or kidneys. However, PVD most often affects a person's lower legs and feet. Without treatment, PVD often worsens. PVD can lead to acute limb ischemia. This occurs when an arm or leg suddenly has trouble getting enough blood. This is a medical emergency. What are the causes? The most common cause of PVD is atherosclerosis. This is a buildup of fatty material and other substances (plaque)inside your arteries. Pieces of plaque can break off from the walls of an artery and become stuck in a smaller artery, blocking blood flow and possibly causing acute limb ischemia. Other common causes of PVD include:  Blood clots that form inside the blood vessels.  Injuries to blood vessels.  Diseases that cause inflammation of blood vessels or cause blood vessel tightening (spasms). What increases the risk? The following factors may make you more likely to develop this condition:  A family history of PVD.  Common medical conditions, including: ? High cholesterol. ? Diabetes. ? High blood pressure (hypertension). ? Heart disease. ? Known atherosclerotic disease in another area of the body. ? Past injury, such as burns or a broken bone.  Other medical conditions, such as: ? Buerger's disease. This is caused by inflamed blood vessels in your hands and feet. ? Some forms of arthritis. ? Birth defects that affect the arteries in your legs. ? Kidney disease.  Using tobacco and nicotine products.  Not getting enough exercise.  Obesity.  Being age 65 or older, or being age 50 or older and having the other risk  factors. What are the signs or symptoms? This condition may cause different symptoms. Your symptoms depend on what body part is not getting enough blood. Common signs and symptoms include:  Cramps in your buttocks, legs, and feet.  Intermittent claudication. This is pain and weakness in your legs during activity that resolves with rest.  Leg pain at rest and leg numbness, tingling, or weakness.  Coldness in a leg or foot, especially when compared to the other leg or foot.  Skin or hair changes. These can include: ? Hair loss. ? Shiny skin. ? Pale or bluish skin. ? Thick toenails.  Inability to get or maintain an erection (erectile dysfunction).  Tiredness (fatigue).  Weak pulse or no pulse in the feet. People with PVD are more likely to develop open wounds (ulcers) and sores on their toes, feet, or legs. The ulcers or sores may take longer than normal to heal. How is this diagnosed? PVD is diagnosed based on your signs and symptoms, a physical exam, and your medical history. You may also have other tests to find the cause. Tests include:  Ankle-brachial index test.This test compares the blood pressure readings of the legs and arms. ? This may also include an exercise ankle-brachial index test in which you walk on a treadmill to check your symptoms.  Doppler ultrasound. This takes pictures of blood flow through your blood vessels.  Imaging studies that use dye to show blood flow. These are: ? CT angiogram. ? Magnetic resonance angiogram, or MRA. How is this treated? Treatment for PVD depends on the cause of your condition, how severe your symptoms   are, and your age. Underlying causes need to be treated and controlled. These include long-term (chronic) conditions, such as diabetes, high cholesterol, and hypertension. Treatment may include:  Lifestyle changes, such as: ? Quitting tobacco use. ? Exercising regularly. ? Following a low-fat, low-cholesterol diet. ? Not drinking  alcohol.  Taking medicines, such as: ? Blood thinners to prevent blood clots. ? Medicines to improve blood flow. ? Medicines to improve cholesterol levels.  Procedures, such as: ? Angioplasty. This uses an inflated balloon to open a blocked artery and improve blood flow. ? Stent implant. This inserts a small mesh tube to keep a blocked artery open. ? Peripheral bypass surgery. This reroutes blood flow around a blocked artery. ? Surgery to remove dead tissue from an infected wound (debridement). ? Amputation. This is surgical removal of the affected limb. It may be necessary in cases of acute limb ischemia when medical or surgical treatments have not helped. Follow these instructions at home: Medicines  Take over-the-counter and prescription medicines only as told by your health care provider.  If you are taking blood thinners: ? Talk with your health care provider before you take any medicines that contain aspirin or NSAIDs, such as ibuprofen. These medicines increase your risk for dangerous bleeding. ? Take your medicine exactly as told, at the same time every day. ? Avoid activities that could cause injury or bruising, and follow instructions about how to prevent falls. ? Wear a medical alert bracelet or carry a card that lists what medicines you take. Lifestyle  Exercise regularly. Ask your health care provider about some good activities for you.  Talk with your health care provider about maintaining a healthy weight. If needed, ask about losing weight.  Eat a diet that is low in fat and cholesterol. If you need help, talk with your health care provider.  Do not drink alcohol.  Do not use any products that contain nicotine or tobacco. These products include cigarettes, chewing tobacco, and vaping devices, such as e-cigarettes. If you need help quitting, ask your health care provider.      General instructions  Take good care of your feet. To do this: ? Wear comfortable shoes  that fit well. ? Check your feet often for any cuts or sores.  Get an annual influenza vaccine.  Keep all follow-up visits. This is important. Where to find more information  Society for Vascular Surgery: vascular.org  American Heart Association: heart.org  National Heart, Lung, and Blood Institute: nhlbi.nih.gov Contact a health care provider if:  You have leg cramps while walking.  You have leg pain when you rest.  Your leg or foot feels cold.  Your skin changes color.  You have erectile dysfunction.  You have cuts or sores on your legs or feet that do not heal. Get help right away if:  You have sudden changes in color and feeling of your arms or legs, such as: ? Your arm or leg turns cold, numb, and blue. ? Your arm or leg becomes red, warm, swollen, painful, or numb.  You have any symptoms of a stroke. "BE FAST" is an easy way to remember the main warning signs of a stroke: ? B - Balance. Signs are dizziness, sudden trouble walking, or loss of balance. ? E - Eyes. Signs are trouble seeing or a sudden change in vision. ? F - Face. Signs are sudden weakness or numbness of the face, or the face or eyelid drooping on one side. ? A - Arms. Signs   are weakness or numbness in an arm. This happens suddenly and usually on one side of the body. ? S - Speech. Signs are sudden trouble speaking, slurred speech, or trouble understanding what people say. ? T - Time. Time to call emergency services. Write down what time symptoms started.  You have other signs of a stroke, such as: ? A sudden, severe headache with no known cause. ? Nausea or vomiting. ? Seizure.  You have chest pain or trouble breathing. These symptoms may represent a serious problem that is an emergency. Do not wait to see if the symptoms will go away. Get medical help right away. Call your local emergency services (911 in the [place] Do not drive yourself to the hospital. Summary  Peripheral vascular disease (PVD)  is a disease of the blood vessels.  PVD is the blocking or hardening of the arteries anywhere within the circulatory system beyond the heart.  PVD may cause different symptoms. Your symptoms depend on what part of your body is not getting enough blood.  Treatment for PVD depends on what caused it, how severe your symptoms are, and your age. This information is not intended to replace advice given to you by your health care provider. Make sure you discuss any questions you have with your health care provider. Document Revised: 02/01/2020 Document Reviewed: 02/01/2020 Elsevier Patient Education  2021 Elsevier Inc.  

## 2020-09-20 ENCOUNTER — Other Ambulatory Visit (INDEPENDENT_AMBULATORY_CARE_PROVIDER_SITE_OTHER): Payer: Self-pay | Admitting: Nurse Practitioner

## 2020-09-20 ENCOUNTER — Telehealth: Payer: Self-pay

## 2020-09-20 DIAGNOSIS — M79672 Pain in left foot: Secondary | ICD-10-CM

## 2020-09-20 DIAGNOSIS — M79671 Pain in right foot: Secondary | ICD-10-CM

## 2020-09-20 DIAGNOSIS — I999 Unspecified disorder of circulatory system: Secondary | ICD-10-CM

## 2020-09-20 NOTE — Telephone Encounter (Signed)
I called and left a message on the nurse's line at [facility] for them to call the pt

## 2020-09-20 NOTE — Telephone Encounter (Unsigned)
Copied from [place] [id number]. Topic: General - Other >> Sep 20, 2020  1:26 PM Alanda Slim E wrote: Reason for CRM: Pt called to check on the issue she is having with her vein and vascular referral she has not heard from them and the pain is not getting any better and she wasn't prescribed anything from Dr. Ronnald Ramp for th pain/ I have called [facility] for the pt with only voicemail and no answer / Pt states she is going out of town this weekend and would like some result before then / Pt would like to handle the situation with her pain or the referral before leaving / please advise

## 2020-09-21 ENCOUNTER — Ambulatory Visit (INDEPENDENT_AMBULATORY_CARE_PROVIDER_SITE_OTHER): Payer: 59 | Admitting: Nurse Practitioner

## 2020-09-21 ENCOUNTER — Telehealth: Payer: Self-pay

## 2020-09-21 ENCOUNTER — Ambulatory Visit (INDEPENDENT_AMBULATORY_CARE_PROVIDER_SITE_OTHER): Payer: 59

## 2020-09-21 ENCOUNTER — Other Ambulatory Visit: Payer: Self-pay

## 2020-09-21 ENCOUNTER — Encounter (INDEPENDENT_AMBULATORY_CARE_PROVIDER_SITE_OTHER): Payer: Self-pay | Admitting: Nurse Practitioner

## 2020-09-21 VITALS — BP 147/77 | HR 69 | Resp 16 | Ht 66.0 in | Wt 170.8 lb

## 2020-09-21 DIAGNOSIS — M79671 Pain in right foot: Secondary | ICD-10-CM

## 2020-09-21 DIAGNOSIS — I10 Essential (primary) hypertension: Secondary | ICD-10-CM

## 2020-09-21 DIAGNOSIS — M79672 Pain in left foot: Secondary | ICD-10-CM

## 2020-09-21 DIAGNOSIS — I999 Unspecified disorder of circulatory system: Secondary | ICD-10-CM

## 2020-09-21 DIAGNOSIS — Z720 Tobacco use: Secondary | ICD-10-CM | POA: Diagnosis not present

## 2020-09-21 DIAGNOSIS — E782 Mixed hyperlipidemia: Secondary | ICD-10-CM

## 2020-09-21 NOTE — Telephone Encounter (Signed)
Copied from [place] [id number]. Topic: General - Other >> Sep 21, 2020 10:44 AM Alanda Slim E wrote: Reason for CRM: Pt would like Baxter Flattery to give her a call about her ultrasound and about some information about getting an MRI/ please advise

## 2020-09-21 NOTE — Telephone Encounter (Signed)
Left message to inform patient to contact [facility].

## 2020-09-21 NOTE — Telephone Encounter (Signed)
Please call her and tell her she needs to discuss the results that [facility] did- with her. [phone]

## 2020-09-22 ENCOUNTER — Telehealth: Payer: Self-pay

## 2020-09-22 ENCOUNTER — Other Ambulatory Visit: Payer: Self-pay | Admitting: Family Medicine

## 2020-09-22 ENCOUNTER — Ambulatory Visit
Admission: RE | Admit: 2020-09-22 | Discharge: 2020-09-22 | Disposition: A | Discharge status: Home / Self Care | Payer: 59 | Source: Ambulatory Visit | Attending: Family Medicine | Admitting: Family Medicine

## 2020-09-22 ENCOUNTER — Ambulatory Visit
Admission: RE | Admit: 2020-09-22 | Discharge: 2020-09-22 | Disposition: A | Discharge status: Home / Self Care | Payer: 59 | Attending: Family Medicine | Admitting: Family Medicine

## 2020-09-22 ENCOUNTER — Other Ambulatory Visit: Payer: Self-pay

## 2020-09-22 DIAGNOSIS — M79605 Pain in left leg: Secondary | ICD-10-CM

## 2020-09-22 DIAGNOSIS — M545 Low back pain, unspecified: Secondary | ICD-10-CM

## 2020-09-22 NOTE — Telephone Encounter (Signed)
Patient ask that you call her to see what is the next step going forward. She mention that Dr Ronnald Ramp said it could be nerve related and to possibly have an back x-ray or MRI done.

## 2020-09-22 NOTE — Telephone Encounter (Signed)
Called pt with placed order for xray, sent referral for ortho and scheduled appt for Shelby Gallagher 09/23/20 @ 3:30 in [place]. Tried to send in Tramadol for the pain, but red flag came up when tried to prescribe it due to allergy to morphine. Offered to prescribe gabapentin, but pt stated she has had a bad reaction to that and doesn't want to try that again. She asked for "Elavil or a narcotic" and she was told Elavil is not really for pain and the narcotics would be flagged same as Tramadol was. Advised to go to urgent care or ER if she can't wait until tomorrow for the ortho appt

## 2020-09-22 NOTE — Telephone Encounter (Signed)
Spoke to pt- put xray in and referral to ortho- will see her tomorrow at 330 at ortho

## 2020-09-28 ENCOUNTER — Encounter (INDEPENDENT_AMBULATORY_CARE_PROVIDER_SITE_OTHER): Payer: Self-pay | Admitting: Nurse Practitioner

## 2020-09-28 NOTE — Progress Notes (Signed)
Subjective:    Patient ID: Shelby Gallagher, female    DOB: 1960/10/22, 60 y.o.   MRN: [id number] Chief Complaint  Patient presents with  . New Patient (Initial Visit)    Ref Ronnald Ramp decreased circulation, bil feet pain    The patient is seen for evaluation of painful lower extremities. Patient notes the pain is variable and not always associated with activity.  The pain is somewhat consistent day to day occurring on most days. The patient notes the pain also occurs with standing and routinely seems worse as the day wears on. The pain has been progressive over the past several years. The patient states these symptoms are causing  a profound negative impact on quality of life and daily activities. The patient endorses that the pain tends to be worse at night with a throbbing sensation and tenderness. She also complains of numbness and tingling in her lower extremities.  The patient denies rest pain or dangling of an extremity off the side of the bed during the night for relief. No open wounds or sores at this time. No history of DVT or phlebitis. No prior interventions or surgeries.  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   Today noninvasive studies show a right ABI 1.27 and a left of 1.30. The patient has a TBI 0.98 on the right and 1.01 on the left. Patient has strong triphasic tibial artery waveforms with good toe waveforms bilaterally.   Review of Systems  Musculoskeletal: Positive for back pain.  Neurological: Positive for numbness.  All other systems reviewed and are negative.      Objective:   Physical Exam Vitals reviewed.  HENT:     Head: Normocephalic.  Cardiovascular:     Rate and Rhythm: Normal rate.     Pulses:          Dorsalis pedis pulses are 1+ on the right side and 1+ on the left side.       Posterior tibial pulses are 1+ on the right side and 1+ on the left side.  Pulmonary:     Effort: Pulmonary effort is normal.   Skin:    General: Skin is warm and dry.  Neurological:     Mental Status: She is alert and oriented to person, place, and time.  Psychiatric:        Mood and Affect: Mood normal.        Behavior: Behavior normal.        Thought Content: Thought content normal.        Judgment: Judgment normal.     BP (!) 147/77 (BP Location: Right Arm)   Pulse 69   Resp 16   Ht 5\' 6"  (1.676 m)   Wt 170 lb 12.8 oz (77.5 kg)   BMI 27.57 kg/m   Past Medical History:  Diagnosis Date  . Anxiety     Social History   Socioeconomic History  . Marital status: Single    Spouse name: Not on file  . Number of children: Not on file  . Years of education: Not on file  . Highest education level: Not on file  Occupational History  . Not on file  Tobacco Use  . Smoking status: Current Every Day Smoker  . Smokeless tobacco: Current User  Substance and Sexual Activity  . Alcohol use: Yes    Alcohol/week: 0.0 standard drinks  . Drug use: No  . Sexual activity: Yes  Other Topics Concern  . Not on file  Social History Narrative  . Not on file   Social Determinants of Health   Financial Resource Strain: Not on file  Food Insecurity: Not on file  Transportation Needs: Not on file  Physical Activity: Not on file  Stress: Not on file  Social Connections: Not on file  Intimate Partner Violence: Not on file    Past Surgical History:  Procedure Laterality Date  . CHOLECYSTECTOMY    . COLONOSCOPY  2015   repeat in 3 years- Dr Janalyn Rouse- [facility]  . TONSILLECTOMY    . VAGINAL HYSTERECTOMY      Family History  Problem Relation Age of Onset  . Cancer Mother   . Hypertension Mother   . Cancer Father   . Hypertension Father   . Stroke Father     Allergies  Allergen Reactions  . Glucosamine Swelling    Other reaction(s): SWELLING Other reaction(s): SWELLING  . Morphine     Other reaction(s): VOMITING Other reaction(s): Vomiting Other reaction(s): VOMITING  Other reaction(s): VOMITING   . Shellfish Allergy Swelling  . Shellfish-Derived Products     Other reaction(s): SWELLING Other reaction(s): SWELLING    CBC Latest Ref Rng & Units 05/09/2018 05/07/2018 09/10/2016  WBC 3.6 - 11.0 K/uL 11.9(H) 9.7 9.5  Hemoglobin 12.0 - 16.0 g/dL 14.9 13.9 14.1  Hematocrit 35.0 - 47.0 % 44.1 39.7 40.9  Platelets 150 - 440 K/uL 233 243 252      CMP     Component Value Date/Time   NA 142 05/09/2018 1205   NA 145 05/15/2012 1846   K 3.9 05/09/2018 1205   K 4.0 05/15/2012 1846   CL 109 05/09/2018 1205   CL 108 (H) 05/15/2012 1846   CO2 25 05/09/2018 1205   CO2 31 05/15/2012 1846   GLUCOSE 96 05/09/2018 1205   GLUCOSE 88 05/15/2012 1846   BUN 9 05/09/2018 1205   BUN 11 05/15/2012 1846   CREATININE 0.82 05/09/2018 1205   CREATININE 0.81 05/15/2012 1846   CALCIUM 9.7 05/09/2018 1205   CALCIUM 9.1 05/15/2012 1846   PROT 7.5 09/10/2016 1847   PROT 6.2 (L) 05/16/2012 0330   ALBUMIN 4.2 09/10/2016 1847   ALBUMIN 3.3 (L) 05/16/2012 0330   AST 68 (H) 09/10/2016 1847   AST 177 (H) 05/16/2012 0330   ALT 66 (H) 09/10/2016 1847   ALT 141 (H) 05/16/2012 0330   ALKPHOS 189 (H) 09/10/2016 1847   ALKPHOS 148 (H) 05/16/2012 0330   BILITOT 0.8 09/10/2016 1847   BILITOT 0.4 05/16/2012 0330   GFRNONAA >60 05/09/2018 1205   GFRNONAA >60 05/15/2012 1846   GFRAA >60 05/09/2018 1205   GFRAA >60 05/15/2012 1846     VAS [place] ABI WITH/WO TBI  Result Date: 09/22/2020 LOWER EXTREMITY DOPPLER STUDY Indications: Numb toes intermittant.  Performing Technologist: Concha Norway RVT  Examination Guidelines: A complete evaluation includes at minimum, Doppler waveform signals and systolic blood pressure reading at the level of bilateral brachial, anterior tibial, and posterior tibial arteries, when vessel segments are accessible. Bilateral testing is considered an integral part of a complete examination. Photoelectric Plethysmograph (PPG) waveforms and toe systolic pressure readings are included as  required and additional duplex testing as needed. Limited examinations for reoccurring indications may be performed as noted.  ABI Findings: +---------+------------------+-----+---------+--------+ Right    Rt Pressure (mmHg)IndexWaveform Comment  +---------+------------------+-----+---------+--------+ Brachial 120                                      +---------+------------------+-----+---------+--------+  ATA      157               1.31 triphasic         +---------+------------------+-----+---------+--------+ PTA      152               1.27 triphasic         +---------+------------------+-----+---------+--------+ Great Toe118               0.98 Normal            +---------+------------------+-----+---------+--------+ +---------+------------------+-----+---------+-------+ Left     Lt Pressure (mmHg)IndexWaveform Comment +---------+------------------+-----+---------+-------+ Brachial 120                                     +---------+------------------+-----+---------+-------+ ATA      156               1.30 triphasic        +---------+------------------+-----+---------+-------+ PTA      152               1.27 triphasic        +---------+------------------+-----+---------+-------+ Great Toe121               1.01 Normal           +---------+------------------+-----+---------+-------+  Summary: Right: Resting right ankle-brachial index is within normal range. No evidence of significant right lower extremity arterial disease. The right toe-brachial index is normal. Left: Resting left ankle-brachial index is within normal range. No evidence of significant left lower extremity arterial disease. The left toe-brachial index is normal.  *See table(s) above for measurements and observations.  Electronically signed by Hortencia Pilar MD on 09/22/2020 at 5:05:28 PM.   Final        Assessment & Plan:   1. Pain in both feet Recommend:  I do not find evidence of  Vascular pathology that would explain the patient's symptoms  The patient has atypical pain symptoms for vascular disease  I do not find evidence of Vascular pathology that would explain the patient's symptoms and I suspect the patient is c/o pseudoclaudication.  Patient should have an evaluation of his LS spine which I defer to the primary service.  Noninvasive studies  do not identify vascular problems  The patient should continue walking and begin a more formal exercise program. The patient should continue his antiplatelet therapy and aggressive treatment of the lipid abnormalities. The patient should begin wearing graduated compression socks 15-20 mmHg strength to control her mild edema.  Patient will follow-up with me on a PRN basis  Further work-up of her lower extremity pain is deferred to the primary service     2. Benign essential HTN Continue antihypertensive medications as already ordered, these medications have been reviewed and there are no changes at this time.   3. Hyperlipidemia, mixed Continue statin as ordered and reviewed, no changes at this time   4. Current tobacco use Smoking cessation was discussed, 3-10 minutes spent on this topic specifically    Current Outpatient Medications on File Prior to Visit  Medication Sig Dispense Refill  . meloxicam (MOBIC) 15 MG tablet Take 1 tablet (15 mg total) by mouth daily. 30 tablet 0  . clorazepate (TRANXENE) 7.5 MG tablet Take 1 tablet (7.5 mg total) by mouth at bedtime as needed for anxiety (#30 TO LAST 90 DAYS WITH NO MORE REFILLS). (Patient not taking: No sig reported) 30  tablet 0   No current facility-administered medications on file prior to visit.    There are no Patient Instructions on file for this visit. No follow-ups on file.   Kris Hartmann, NP

## 2020-10-05 ENCOUNTER — Encounter: Payer: 59 | Admitting: Family Medicine

## 2020-10-09 ENCOUNTER — Other Ambulatory Visit: Payer: Self-pay | Admitting: Family Medicine

## 2020-10-09 DIAGNOSIS — M545 Low back pain, unspecified: Secondary | ICD-10-CM

## 2020-10-09 DIAGNOSIS — M7711 Lateral epicondylitis, right elbow: Secondary | ICD-10-CM

## 2020-10-09 DIAGNOSIS — M79605 Pain in left leg: Secondary | ICD-10-CM

## 2020-10-09 NOTE — Telephone Encounter (Signed)
Requested Prescriptions  Pending Prescriptions Disp Refills  . meloxicam (MOBIC) 15 MG tablet [Pharmacy Med Name: MELOXICAM 15 MG TABLET] 30 tablet 0    Sig: TAKE 1 TABLET (15 MG TOTAL) BY MOUTH DAILY.     Analgesics:  COX2 Inhibitors Failed - 10/09/2020 12:45 AM      Failed - HGB in normal range and within 360 days    Hemoglobin  Date Value Ref Range Status  05/09/2018 14.9 12.0 - 16.0 g/dL Final   HGB  Date Value Ref Range Status  05/15/2012 12.7 12.0 - 16.0 g/dL Final         Failed - Cr in normal range and within 360 days    Creatinine  Date Value Ref Range Status  05/15/2012 0.81 0.60 - 1.30 mg/dL Final   Creatinine, Ser  Date Value Ref Range Status  05/09/2018 0.82 0.44 - 1.00 mg/dL Final         Passed - Patient is not pregnant      Passed - Valid encounter within last 12 months    Recent Outpatient Visits          3 weeks ago Lumbar pain with radiation down left leg   [facility] Juline Patch, MD   2 years ago Acute intractable headache, unspecified headache type   [facility] Juline Patch, MD   2 years ago Cigarette nicotine dependence without complication   [facility] Juline Patch, MD   3 years ago Chest pain, unspecified type   [facility] Juline Patch, MD   4 years ago Renal colic on left side   [facility] Glean Hess, MD      Future Appointments            In 1 week Juline Patch, MD [facility], [facility]

## 2020-10-18 ENCOUNTER — Encounter: Payer: Self-pay | Admitting: Family Medicine

## 2020-10-18 ENCOUNTER — Other Ambulatory Visit: Payer: Self-pay

## 2020-10-18 ENCOUNTER — Ambulatory Visit (INDEPENDENT_AMBULATORY_CARE_PROVIDER_SITE_OTHER): Payer: 59 | Admitting: Family Medicine

## 2020-10-18 VITALS — BP 120/80 | HR 74 | Ht 66.0 in | Wt 170.0 lb

## 2020-10-18 DIAGNOSIS — Z1211 Encounter for screening for malignant neoplasm of colon: Secondary | ICD-10-CM

## 2020-10-18 DIAGNOSIS — Z0000 Encounter for general adult medical examination without abnormal findings: Secondary | ICD-10-CM

## 2020-10-18 DIAGNOSIS — Z1231 Encounter for screening mammogram for malignant neoplasm of breast: Secondary | ICD-10-CM

## 2020-10-18 LAB — POCT URINALYSIS DIPSTICK
Bilirubin, UA: NEGATIVE
Blood, UA: NEGATIVE
Glucose, UA: NEGATIVE
Ketones, UA: NEGATIVE
Leukocytes, UA: NEGATIVE
Nitrite, UA: NEGATIVE
Protein, UA: NEGATIVE
Spec Grav, UA: 1.015 (ref 1.010–1.025)
Urobilinogen, UA: 0.2 E.U./dL
pH, UA: 5 (ref 5.0–8.0)

## 2020-10-18 LAB — HEMOCCULT GUIAC POC 1CARD (OFFICE): Fecal Occult Blood, POC: NEGATIVE

## 2020-10-18 NOTE — Progress Notes (Signed)
Date:  10/18/2020   Name:  Shelby Gallagher   DOB:  11-Jul-1961   MRN:  [id number]   Chief Complaint: Annual Exam (No pap)  Patient is a 60 year old female who presents for a comprehensive physical exam. The patient reports the following problems: sciatica. Health maintenance has been reviewed up to date.   Lab Results  Component Value Date   CREATININE 0.82 05/09/2018   BUN 9 05/09/2018   NA 142 05/09/2018   K 3.9 05/09/2018   CL 109 05/09/2018   CO2 25 05/09/2018   No results found for: CHOL, HDL, LDLCALC, LDLDIRECT, TRIG, CHOLHDL No results found for: TSH No results found for: HGBA1C Lab Results  Component Value Date   WBC 11.9 (H) 05/09/2018   HGB 14.9 05/09/2018   HCT 44.1 05/09/2018   MCV 86.1 05/09/2018   PLT 233 05/09/2018   Lab Results  Component Value Date   ALT 66 (H) 09/10/2016   AST 68 (H) 09/10/2016   ALKPHOS 189 (H) 09/10/2016   BILITOT 0.8 09/10/2016     Review of Systems  Constitutional: Negative.  Negative for chills, fatigue, fever and unexpected weight change.  HENT: Negative for congestion, ear discharge, ear pain, rhinorrhea, sinus pressure, sneezing and sore throat.   Eyes: Negative for double vision, photophobia, pain, discharge, redness and itching.  Respiratory: Negative for cough, shortness of breath, wheezing and stridor.   Gastrointestinal: Negative for abdominal pain, blood in stool, constipation, diarrhea, nausea and vomiting.  Endocrine: Negative for cold intolerance, heat intolerance, polydipsia, polyphagia and polyuria.  Genitourinary: Negative for dysuria, flank pain, frequency, hematuria, menstrual problem, pelvic pain, urgency, vaginal bleeding and vaginal discharge.  Musculoskeletal: Positive for back pain. Negative for arthralgias and myalgias.  Skin: Negative for rash.  Allergic/Immunologic: Negative for environmental allergies and food allergies.  Neurological: Negative for dizziness, weakness, light-headedness,  numbness and headaches.       Paresthesias  Hematological: Negative for adenopathy. Does not bruise/bleed easily.  Psychiatric/Behavioral: Negative for dysphoric mood. The patient is not nervous/anxious.     Patient Active Problem List   Diagnosis Date Noted  . Hyperlipidemia, mixed 07/03/2018  . Benign essential HTN 06/18/2017  . Stable angina pectoris ([place]) 06/18/2017  . Current tobacco use 08/23/2014  . Cannot sleep 01/06/2003    Allergies  Allergen Reactions  . Glucosamine Swelling    Other reaction(s): SWELLING Other reaction(s): SWELLING  . Shellfish Allergy Swelling  . Shellfish-Derived Products     Other reaction(s): SWELLING Other reaction(s): SWELLING    Past Surgical History:  Procedure Laterality Date  . CHOLECYSTECTOMY    . COLONOSCOPY  2015   repeat in 3 years- Dr Janalyn Rouse- [place]  . TONSILLECTOMY    . VAGINAL HYSTERECTOMY      Social History   Tobacco Use  . Smoking status: Current Every Day Smoker  . Smokeless tobacco: Current User  Substance Use Topics  . Alcohol use: Yes    Alcohol/week: 0.0 standard drinks  . Drug use: No     Medication list has been reviewed and updated.  No outpatient medications have been marked as taking for the 10/18/20 encounter (Office Visit) with Juline Patch, MD.    [facility] 2/9 Scores 09/16/2020 01/02/2018 06/14/2017 10/12/2015  PHQ - 2 Score 0 0 0 0  PHQ- 9 Score - 4 4 -    No flowsheet data found.  BP Readings from Last 3 Encounters:  10/18/20 120/80  09/21/20 (!) 147/77  09/16/20 112/80  Physical Exam Vitals and nursing note reviewed.  Constitutional:      Appearance: She is well-developed, well-groomed, normal weight and well-nourished.  HENT:     Head: Normocephalic.     Jaw: There is normal jaw occlusion.     Right Ear: Hearing, tympanic membrane, ear canal and external ear normal. There is no impacted cerumen.     Left Ear: Hearing, tympanic membrane, ear canal and external ear normal. There is no  impacted cerumen.     Nose: Nose normal. No congestion.     Mouth/Throat:     Lips: Pink.     Mouth: Oropharynx is clear and moist. Mucous membranes are moist.     Pharynx: Oropharynx is clear. Uvula midline.     Tonsils: No tonsillar exudate.  Eyes:     General: Lids are normal. Vision grossly intact. Gaze aligned appropriately. No scleral icterus.    Extraocular Movements: EOM normal.     Conjunctiva/sclera: Conjunctivae normal.     Right eye: Right conjunctiva is not injected.     Left eye: Left conjunctiva is not injected.     Pupils: Pupils are equal, round, and reactive to light.  Neck:     Thyroid: No thyroid mass, thyromegaly or thyroid tenderness.     Vascular: Normal carotid pulses. No carotid bruit, hepatojugular reflux or JVD.     Trachea: No tracheal deviation.  Cardiovascular:     Rate and Rhythm: Normal rate and regular rhythm.     Chest Wall: PMI is not displaced.     Pulses: Intact distal pulses. No decreased pulses.          Carotid pulses are 2+ on the right side and 2+ on the left side.      Radial pulses are 2+ on the right side and 2+ on the left side.       Femoral pulses are 2+ on the right side and 2+ on the left side.      Popliteal pulses are 2+ on the right side and 2+ on the left side.       Dorsalis pedis pulses are 1+ on the right side and 1+ on the left side.       Posterior tibial pulses are 1+ on the right side and 1+ on the left side.     Heart sounds: Normal heart sounds, S1 normal and S2 normal. No murmur heard.  No systolic murmur is present.  No diastolic murmur is present. No friction rub. No gallop. No S3 or S4 sounds.   Pulmonary:     Effort: Pulmonary effort is normal. No respiratory distress.     Breath sounds: Normal breath sounds. No decreased breath sounds, wheezing, rhonchi or rales.  Chest:  Breasts: Breasts are symmetrical.     Right: Normal. No swelling, bleeding, inverted nipple, mass, nipple discharge, skin change, tenderness,  axillary adenopathy or supraclavicular adenopathy.     Left: Normal. No swelling, bleeding, inverted nipple, mass, nipple discharge, skin change, tenderness, axillary adenopathy or supraclavicular adenopathy.    Abdominal:     General: Bowel sounds are normal.     Palpations: Abdomen is soft. There is no hepatomegaly, splenomegaly, hepatosplenomegaly or mass.     Tenderness: There is no abdominal tenderness. There is no guarding or rebound.  Genitourinary:    Rectum: Normal. Guaiac result negative. No mass.  Musculoskeletal:        General: No tenderness or edema. Normal range of motion.     Cervical  back: Full passive range of motion without pain, normal range of motion and neck supple.     Right lower leg: No edema.     Left lower leg: No edema.  Lymphadenopathy:     Head:     Right side of head: No submandibular adenopathy.     Left side of head: No submandibular adenopathy.     Cervical: No cervical adenopathy.     Right cervical: No superficial, deep or posterior cervical adenopathy.    Left cervical: No superficial, deep or posterior cervical adenopathy.     Upper Body:     Right upper body: No supraclavicular or axillary adenopathy.     Left upper body: No supraclavicular or axillary adenopathy.  Skin:    General: Skin is warm.     Capillary Refill: Capillary refill takes less than 2 seconds.     Findings: No rash.  Neurological:     General: No focal deficit present.     Mental Status: She is alert and oriented to person, place, and time.     Cranial Nerves: Cranial nerves are intact. No cranial nerve deficit.     Sensory: Sensation is intact.     Motor: Motor function is intact.     Deep Tendon Reflexes: Strength normal and reflexes are normal and symmetric. Reflexes normal.  Psychiatric:        Mood and Affect: Mood and affect normal. Mood is not anxious or depressed.        Behavior: Behavior is cooperative.     Wt Readings from Last 3 Encounters:  10/18/20 170  lb (77.1 kg)  09/21/20 170 lb 12.8 oz (77.5 kg)  09/16/20 170 lb (77.1 kg)    BP 120/80   Pulse 74   Ht 5\' 6"  (1.676 m)   Wt 170 lb (77.1 kg)   BMI 27.44 kg/m   Assessment and Plan:  1. Annual physical exam Patient's chart was reviewed from previous encounters, most recent labs, most recent imaging, and care everywhere.  No subjective/objective concerns noted with history and physical exam.Darriana Khanh Cordner is a 60 y.o. female who presents today for her Complete Annual Exam. She feels well. She reports exercising additional. She reports she is sleeping well. Immunizations are reviewed and recommendations provided.   Age appropriate screening tests are discussed. Counseling given for risk factor reduction interventions.  We will obtain a lipid panel comprehensive metabolic panel and urinalysis. - Lipid Panel With LDL/HDL Ratio - Comprehensive metabolic panel - POCT urinalysis dipstick  2. Breast cancer screening by mammogram Breast exam is normal with no palpable masses we will proceed with 3D mammogram bilateral. - MM 3D SCREEN BREAST BILATERAL; Future  3. Colon cancer screening DRE is normal without palpable mass and guaiac is negative we will proceed with referral to gastroenterology for colonoscopy evaluation. - Ambulatory referral to Gastroenterology - POCT occult blood stool

## 2020-10-19 LAB — LIPID PANEL WITH LDL/HDL RATIO
Cholesterol, Total: 213 mg/dL — ABNORMAL HIGH (ref 100–199)
HDL: 50 mg/dL (ref >39)
LDL Chol Calc (NIH): 148 mg/dL — ABNORMAL HIGH (ref 0–99)
LDL/HDL Ratio: 3 ratio (ref 0.0–3.2)
Triglycerides: 82 mg/dL (ref 0–149)
VLDL Cholesterol Cal: 15 mg/dL (ref 5–40)

## 2020-10-19 LAB — COMPREHENSIVE METABOLIC PANEL
ALT: 14 IU/L (ref 0–32)
AST: 20 IU/L (ref 0–40)
Albumin/Globulin Ratio: 1.7 (ref 1.2–2.2)
Albumin: 4.3 g/dL (ref 3.8–4.9)
Alkaline Phosphatase: 169 IU/L — ABNORMAL HIGH (ref 44–121)
BUN/Creatinine Ratio: 8 — ABNORMAL LOW (ref 9–23)
BUN: 6 mg/dL (ref 6–24)
Bilirubin Total: 0.4 mg/dL (ref 0.0–1.2)
CO2: 21 mmol/L (ref 20–29)
Calcium: 9.6 mg/dL (ref 8.7–10.2)
Chloride: 104 mmol/L (ref 96–106)
Creatinine, Ser: 0.78 mg/dL (ref 0.57–1.00)
Globulin, Total: 2.5 g/dL (ref 1.5–4.5)
Glucose: 82 mg/dL (ref 65–99)
Potassium: 4.8 mmol/L (ref 3.5–5.2)
Sodium: 143 mmol/L (ref 134–144)
Total Protein: 6.8 g/dL (ref 6.0–8.5)
eGFR: 89 mL/min/{1.73_m2} (ref >59)

## 2020-10-31 ENCOUNTER — Other Ambulatory Visit: Payer: Self-pay

## 2020-10-31 ENCOUNTER — Other Ambulatory Visit: Payer: Self-pay | Admitting: Family Medicine

## 2020-10-31 ENCOUNTER — Ambulatory Visit
Admission: RE | Admit: 2020-10-31 | Discharge: 2020-10-31 | Disposition: A | Discharge status: Home / Self Care | Payer: 59 | Source: Ambulatory Visit | Attending: Family Medicine | Admitting: Family Medicine

## 2020-10-31 DIAGNOSIS — Z1231 Encounter for screening mammogram for malignant neoplasm of breast: Secondary | ICD-10-CM

## 2020-10-31 DIAGNOSIS — Z86010 Personal history of colonic polyps: Secondary | ICD-10-CM

## 2020-11-01 ENCOUNTER — Inpatient Hospital Stay
Admission: RE | Admit: 2020-11-01 | Discharge: 2020-11-01 | Disposition: A | Discharge status: Home / Self Care | Payer: Self-pay | Source: Ambulatory Visit | Attending: *Deleted | Admitting: *Deleted

## 2020-11-01 ENCOUNTER — Other Ambulatory Visit: Payer: Self-pay | Admitting: *Deleted

## 2020-11-01 ENCOUNTER — Other Ambulatory Visit: Payer: Self-pay

## 2020-11-01 DIAGNOSIS — Z1231 Encounter for screening mammogram for malignant neoplasm of breast: Secondary | ICD-10-CM

## 2020-11-01 MED ORDER — SUTAB 1479-225-188 MG PO TABS: 375.0000 mg | ORAL_TABLET | ORAL | 0 refills | Status: DC

## 2020-11-10 ENCOUNTER — Encounter: Payer: Self-pay | Admitting: Family Medicine

## 2020-11-10 ENCOUNTER — Ambulatory Visit (INDEPENDENT_AMBULATORY_CARE_PROVIDER_SITE_OTHER): Payer: 59 | Admitting: Family Medicine

## 2020-11-10 ENCOUNTER — Other Ambulatory Visit: Payer: Self-pay

## 2020-11-10 VITALS — BP 130/88 | HR 80 | Temp 98.2°F | Ht 67.0 in | Wt 170.0 lb

## 2020-11-10 DIAGNOSIS — B009 Herpesviral infection, unspecified: Secondary | ICD-10-CM

## 2020-11-10 DIAGNOSIS — K1120 Sialoadenitis, unspecified: Secondary | ICD-10-CM

## 2020-11-10 MED ORDER — VALACYCLOVIR HCL 1 G PO TABS
2000.0000 mg | ORAL_TABLET | Freq: Two times a day (BID) | ORAL | 1 refills | Status: DC
Start: 2020-11-10 — End: 2022-01-11

## 2020-11-10 MED ORDER — AMOXICILLIN-POT CLAVULANATE 875-125 MG PO TABS: 1.0000 | ORAL_TABLET | Freq: Two times a day (BID) | ORAL | 0 refills | Status: DC

## 2020-11-10 NOTE — Progress Notes (Signed)
Date:  11/10/2020   Name:  Shelby Gallagher   DOB:  Feb 05, 1961   MRN:  [id number]   Chief Complaint: Facial Swelling  Neck Pain  This is a new problem. The current episode started today. The problem occurs constantly. The problem has been waxing and waning. Associated with: eating. The pain is present in the right side. The quality of the pain is described as aching. The pain is at a severity of 5/10. The pain is moderate. Exacerbated by: eating. Pertinent negatives include no fever, headaches, numbness, photophobia or weakness. The treatment provided moderate relief.    Lab Results  Component Value Date   CREATININE 0.78 10/18/2020   BUN 6 10/18/2020   NA 143 10/18/2020   K 4.8 10/18/2020   CL 104 10/18/2020   CO2 21 10/18/2020   Lab Results  Component Value Date   CHOL 213 (H) 10/18/2020   HDL 50 10/18/2020   LDLCALC 148 (H) 10/18/2020   TRIG 82 10/18/2020   No results found for: TSH No results found for: HGBA1C Lab Results  Component Value Date   WBC 11.9 (H) 05/09/2018   HGB 14.9 05/09/2018   HCT 44.1 05/09/2018   MCV 86.1 05/09/2018   PLT 233 05/09/2018   Lab Results  Component Value Date   ALT 14 10/18/2020   AST 20 10/18/2020   ALKPHOS 169 (H) 10/18/2020   BILITOT 0.4 10/18/2020     Review of Systems  Constitutional: Negative.  Negative for chills, fatigue, fever and unexpected weight change.  HENT: Negative for congestion, ear discharge, ear pain, rhinorrhea, sinus pressure, sneezing and sore throat.   Eyes: Negative for photophobia, pain, discharge, redness and itching.  Respiratory: Negative for cough, shortness of breath, wheezing and stridor.   Gastrointestinal: Negative for abdominal pain, blood in stool, constipation, diarrhea, nausea and vomiting.  Endocrine: Negative for cold intolerance, heat intolerance, polydipsia, polyphagia and polyuria.  Genitourinary: Negative for dysuria, flank pain, frequency, hematuria, menstrual problem,  pelvic pain, urgency, vaginal bleeding and vaginal discharge.  Musculoskeletal: Positive for neck pain. Negative for arthralgias, back pain and myalgias.  Skin: Negative for rash.  Allergic/Immunologic: Negative for environmental allergies and food allergies.  Neurological: Negative for dizziness, weakness, light-headedness, numbness and headaches.  Hematological: Negative for adenopathy. Does not bruise/bleed easily.  Psychiatric/Behavioral: Negative for dysphoric mood. The patient is not nervous/anxious.     Patient Active Problem List   Diagnosis Date Noted  . Hyperlipidemia, mixed 07/03/2018  . Benign essential HTN 06/18/2017  . Stable angina pectoris ([place]) 06/18/2017  . Current tobacco use 08/23/2014  . Cannot sleep 01/06/2003    Allergies  Allergen Reactions  . Glucosamine Swelling    Other reaction(s): SWELLING Other reaction(s): SWELLING  . Shellfish Allergy Swelling  . Shellfish-Derived Products     Other reaction(s): SWELLING Other reaction(s): SWELLING    Past Surgical History:  Procedure Laterality Date  . AUGMENTATION MAMMAPLASTY Bilateral 2012  . CHOLECYSTECTOMY    . COLONOSCOPY  2015   repeat in 3 years- Dr Janalyn Rouse- [place]  . TONSILLECTOMY    . VAGINAL HYSTERECTOMY      Social History   Tobacco Use  . Smoking status: Current Every Day Smoker  . Smokeless tobacco: Current User  Substance Use Topics  . Alcohol use: Yes    Alcohol/week: 0.0 standard drinks  . Drug use: No     Medication list has been reviewed and updated.  No outpatient medications have been marked as taking for the  11/10/20 encounter (Office Visit) with Juline Patch, MD.    [facility] 2/9 Scores 09/16/2020 01/02/2018 06/14/2017 10/12/2015  PHQ - 2 Score 0 0 0 0  PHQ- 9 Score - 4 4 -    No flowsheet data found.  BP Readings from Last 3 Encounters:  11/10/20 130/88  10/18/20 120/80  09/21/20 (!) 147/77    Physical Exam Constitutional:      General: She is not in acute distress.     Appearance: She is well-developed. She is not diaphoretic.  HENT:     Head: Normocephalic and atraumatic.     Right Ear: Tympanic membrane, ear canal and external ear normal.     Left Ear: Tympanic membrane, ear canal and external ear normal.     Nose: Nose normal.     Mouth/Throat:     Lips: Pink. Lesions present.     Mouth: Mucous membranes are moist.     Tongue: No lesions.     Pharynx: Oropharynx is clear. Uvula midline. No pharyngeal swelling, oropharyngeal exudate, posterior oropharyngeal erythema or uvula swelling.     Comments: HSV 1 Eyes:     General: Lids are everted, no foreign bodies appreciated. No scleral icterus.       Right eye: No discharge.        Left eye: No foreign body, discharge or hordeolum.     Conjunctiva/sclera: Conjunctivae normal.     Right eye: Right conjunctiva is not injected.     Left eye: Left conjunctiva is not injected.     Pupils: Pupils are equal, round, and reactive to light.  Neck:     Thyroid: No thyromegaly.     Vascular: No JVD.     Trachea: No tracheal deviation.  Cardiovascular:     Rate and Rhythm: Normal rate and regular rhythm.     Heart sounds: Normal heart sounds. No murmur heard. No friction rub. No gallop.   Pulmonary:     Effort: Pulmonary effort is normal. No respiratory distress.     Breath sounds: Normal breath sounds. No wheezing or rales.  Abdominal:     General: Bowel sounds are normal.     Palpations: Abdomen is soft. There is no mass.     Tenderness: There is no abdominal tenderness. There is no guarding or rebound.  Musculoskeletal:        General: No tenderness. Normal range of motion.     Cervical back: Normal range of motion and neck supple.  Lymphadenopathy:     Cervical: No cervical adenopathy.  Skin:    General: Skin is warm and dry.     Findings: No rash.  Neurological:     Mental Status: She is alert and oriented to person, place, and time.     Cranial Nerves: No cranial nerve deficit.     Deep  Tendon Reflexes: Reflexes are normal and symmetric. Reflexes normal.  Psychiatric:        Mood and Affect: Mood is not anxious or depressed.     Wt Readings from Last 3 Encounters:  11/10/20 170 lb (77.1 kg)  10/18/20 170 lb (77.1 kg)  09/21/20 170 lb 12.8 oz (77.5 kg)    BP 130/88   Pulse 80   Temp 98.2 F (36.8 C) (Oral)   Ht 5\' 7"  (1.702 m)   Wt 170 lb (77.1 kg)   BMI 26.63 kg/m    Assessment and Plan: 1. Parotiditis Exam and history is consistent with parotitis.  Patient has swelling  with tenderness of the right parotid gland.  There was no suggestion of stone noted on examination orally.  We will treat with Augmentin 875 mg twice a day for 7 days.  Patient is also been instructed to use a sour-based candy or drink and to set up and to hydrate well. - amoxicillin-clavulanate (AUGMENTIN) 875-125 MG tablet; Take 1 tablet by mouth 2 (two) times daily.  Dispense: 14 tablet; Refill: 0  2. 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  Patient has a cold sore on the left lower labia.  This is consistent with a HSV-1 infection we will treat with Valtrex 2 g twice a day for 1 day. - valACYclovir (VALTREX) 1000 MG tablet; Take 2 tablets (2,000 mg total) by mouth 2 (two) times daily.  Dispense: 4 tablet; Refill: 1

## 2020-11-10 NOTE — Patient Instructions (Signed)
Parotitis  Parotitis is inflammation of one or both of your parotid glands. These glands produce saliva. They are found on each side of your face, below and in front of your earlobes. The saliva that they produce comes out of tiny openings (ducts) inside your cheeks. Parotitis may cause sudden swelling and pain (acute parotitis). It can also cause repeated episodes of swelling and pain or continued swelling that may or may not be painful (chronic parotitis). What are the causes? This condition may be caused by:  Infections from bacteria.  Infections from viruses, such as mumps or HIV.  Blockage (obstruction) of saliva flow through the parotid glands. This can be from a stone, scar tissue, or a tumor.  Diseases that cause your body's defense system (immune system) to attack healthy cells in your salivary glands. These are called autoimmune diseases. What increases the risk? You are more likely to develop this condition if:  You are 47 years old or older.  You do not drink enough fluids (are dehydrated).  You drink too much alcohol.  You have: ? A dry mouth. ? Poor dental hygiene. ? Diabetes. ? Gout. ? A long-term illness.  You have had radiation treatments to the head and neck.  You take certain medicines. What are the signs or symptoms? Symptoms of this condition depend on the cause. Symptoms may include:  Swelling under and in front of the ear. This may get worse after eating.  Redness of the skin over the parotid gland.  Pain and tenderness over the parotid gland. This may get worse after eating.  Fever or chills.  Pus coming from the ducts inside the mouth.  Dry mouth.  A bad taste in the mouth. How is this diagnosed? This condition may be diagnosed based on:  Your medical history.  A physical exam.  Tests to find the cause of the parotitis. These may include: ? Doing blood tests to check for an autoimmune disease or infections from a virus. ? Taking a  fluid sample from the parotid gland and testing it for infection. ? Injecting the ducts of the parotid gland with a dye and then taking X-rays (sialogram). ? Having other imaging tests of the gland, such as X-rays, ultrasound, MRI, or CT scan. ? Checking the opening of the gland for a stone or obstruction. ? Placing a needle into the gland to remove tissue for a biopsy (fine needle aspiration). How is this treated? Treatment for this condition depends on the cause. Treatment may include:  Antibiotic medicine for a bacterial infection.  Drinking more fluids.  Removing a stone or obstruction.  Treating an underlying disease that is causing parotitis.  Surgery to drain an infection, remove a growth, or remove the whole gland (parotidectomy). Treatment may not be needed if parotid swelling goes away with home care. Follow these instructions at home: Medicines  Take over-the-counter and prescription medicines only as told by your health care provider.  If you were prescribed an antibiotic medicine, take it as told by your health care provider. Do not stop taking the antibiotic even if you start to feel better.   Managing pain and swelling  If directed, apply heat to the affected area as often as told by your health care provider. Use the heat source that your health care provider recommends, such as a moist heat pack or a heating pad. To apply the heat: ? Place a towel between your skin and the heat source. ? Leave the heat on for 20-30  minutes. ? Remove the heat if your skin turns bright red. This is especially important if you are unable to feel pain, heat, or cold. You may have a greater risk of getting burned.  Gargle with a salt-water mixture 3-4 times a day or as needed. To make a salt-water mixture, completely dissolve -1 tsp (3-6 g) of salt in 1 cup (237 mL) of warm water.  Gently massage the parotid glands as told by your health care provider. General instructions  Drink  enough fluid to keep your urine pale yellow.  Keep your mouth clean and moist.  Try sucking on sour candy. This may help to make your mouth less dry by stimulating the flow of saliva.  Maintain good oral health. ? Brush your teeth at least two times a day. ? Floss your teeth every day. ? See your dentist regularly.  Do not use any products that contain nicotine or tobacco, such as cigarettes, e-cigarettes, and chewing tobacco. If you need help quitting, ask your health care provider.  Do not drink alcohol.  Keep all follow-up visits as told by your health care provider. This is important.   Contact a health care provider if:  You have a fever or chills.  You have new symptoms.  Your symptoms get worse.  Your symptoms do not improve with treatment. Get help right away if:  You have difficulty breathing or swallowing because of the swollen gland. Summary  Parotitis is inflammation of one or both of your parotid glands.  Symptoms include pain and swelling under and in front of the ear. They may also include a fever and a bad taste in your mouth.  This condition may be treated with antibiotics, increasing fluids, or surgery.  In some cases, parotitis may go away on its own without treatment.  You should drink plenty of fluids, maintain good oral hygiene, and avoid tobacco products. This information is not intended to replace advice given to you by your health care provider. Make sure you discuss any questions you have with your health care provider. Document Revised: 02/25/2018 Document Reviewed: 02/25/2018 Elsevier Patient Education  [place].

## 2020-11-23 DIAGNOSIS — Z90710 Acquired absence of both cervix and uterus: Secondary | ICD-10-CM | POA: Insufficient documentation

## 2020-12-07 ENCOUNTER — Other Ambulatory Visit: Payer: Self-pay | Admitting: Family Medicine

## 2020-12-07 DIAGNOSIS — M7712 Lateral epicondylitis, left elbow: Secondary | ICD-10-CM

## 2020-12-07 DIAGNOSIS — M7711 Lateral epicondylitis, right elbow: Secondary | ICD-10-CM

## 2020-12-07 DIAGNOSIS — M545 Low back pain, unspecified: Secondary | ICD-10-CM

## 2020-12-07 NOTE — Telephone Encounter (Signed)
Requested Prescriptions  Pending Prescriptions Disp Refills  . meloxicam (MOBIC) 15 MG tablet [Pharmacy Med Name: MELOXICAM 15 MG TABLET] 90 tablet 3    Sig: TAKE 1 TABLET (15 MG TOTAL) BY MOUTH DAILY.     Analgesics:  COX2 Inhibitors Failed - 12/07/2020  1:22 AM      Failed - HGB in normal range and within 360 days    Hemoglobin  Date Value Ref Range Status  05/09/2018 14.9 12.0 - 16.0 g/dL Final   HGB  Date Value Ref Range Status  05/15/2012 12.7 12.0 - 16.0 g/dL Final         Passed - Cr in normal range and within 360 days    Creatinine  Date Value Ref Range Status  05/15/2012 0.81 0.60 - 1.30 mg/dL Final   Creatinine, Ser  Date Value Ref Range Status  10/18/2020 0.78 0.57 - 1.00 mg/dL Final         Passed - Patient is not pregnant      Passed - Valid encounter within last 12 months    Recent Outpatient Visits          3 weeks ago Parotiditis   [place], [place]   1 month ago Annual physical exam   [facility] Juline Patch, MD   2 months ago Lumbar pain with radiation down left leg   [facility] Juline Patch, MD   2 years ago Acute intractable headache, unspecified headache type   [facility] Juline Patch, MD   2 years ago Cigarette nicotine dependence without complication   [facility] Juline Patch, MD      Future Appointments            In 10 months Juline Patch, MD [facility], [facility]

## 2021-03-21 ENCOUNTER — Other Ambulatory Visit: Payer: Self-pay

## 2021-03-21 ENCOUNTER — Encounter: Payer: Self-pay | Admitting: Gastroenterology

## 2021-03-21 MED ORDER — SUTAB 1479-225-188 MG PO TABS: ORAL_TABLET | ORAL | 0 refills | Status: DC

## 2021-03-27 ENCOUNTER — Ambulatory Visit: Payer: 59 | Admitting: Anesthesiology

## 2021-03-27 ENCOUNTER — Ambulatory Visit
Admission: RE | Admit: 2021-03-27 | Discharge: 2021-03-27 | Disposition: A | Discharge status: Home / Self Care | Payer: 59 | Attending: Gastroenterology | Admitting: Gastroenterology

## 2021-03-27 ENCOUNTER — Other Ambulatory Visit: Payer: Self-pay

## 2021-03-27 ENCOUNTER — Encounter
Admission: RE | Disposition: A | Discharge status: Home / Self Care | Payer: Self-pay | Source: Home / Self Care | Attending: Gastroenterology

## 2021-03-27 ENCOUNTER — Encounter: Payer: Self-pay | Admitting: Gastroenterology

## 2021-03-27 DIAGNOSIS — D124 Benign neoplasm of descending colon: Secondary | ICD-10-CM | POA: Diagnosis not present

## 2021-03-27 DIAGNOSIS — Z888 Allergy status to other drugs, medicaments and biological substances status: Secondary | ICD-10-CM | POA: Diagnosis not present

## 2021-03-27 DIAGNOSIS — D125 Benign neoplasm of sigmoid colon: Secondary | ICD-10-CM | POA: Diagnosis not present

## 2021-03-27 DIAGNOSIS — Z91013 Allergy to seafood: Secondary | ICD-10-CM | POA: Diagnosis not present

## 2021-03-27 DIAGNOSIS — K635 Polyp of colon: Secondary | ICD-10-CM

## 2021-03-27 DIAGNOSIS — F17200 Nicotine dependence, unspecified, uncomplicated: Secondary | ICD-10-CM | POA: Insufficient documentation

## 2021-03-27 DIAGNOSIS — Z9049 Acquired absence of other specified parts of digestive tract: Secondary | ICD-10-CM | POA: Diagnosis not present

## 2021-03-27 DIAGNOSIS — Z860100 Personal history of colon polyps, unspecified: Secondary | ICD-10-CM

## 2021-03-27 DIAGNOSIS — Z1211 Encounter for screening for malignant neoplasm of colon: Secondary | ICD-10-CM | POA: Diagnosis not present

## 2021-03-27 HISTORY — PX: COLONOSCOPY WITH PROPOFOL: SHX5780

## 2021-03-27 HISTORY — PX: POLYPECTOMY: SHX5525

## 2021-03-27 HISTORY — DX: Other complications of anesthesia, initial encounter: T88.59XA

## 2021-03-27 HISTORY — DX: Presence of dental prosthetic device (complete) (partial): Z97.2

## 2021-03-27 LAB — HM COLONOSCOPY

## 2021-03-27 SURGERY — COLONOSCOPY WITH PROPOFOL
Anesthesia: General | Site: Rectum

## 2021-03-27 MED ORDER — LIDOCAINE HCL (CARDIAC) PF 100 MG/5ML IV SOSY
PREFILLED_SYRINGE | INTRAVENOUS | Status: DC | PRN
  Administered 2021-03-27: 50 mg via INTRAVENOUS

## 2021-03-27 MED ORDER — ACETAMINOPHEN 325 MG PO TABS: 325.0000 mg | ORAL_TABLET | ORAL | Status: DC | PRN

## 2021-03-27 MED ORDER — ONDANSETRON HCL 4 MG/2ML IJ SOLN: 4.0000 mg | Freq: Once | INTRAMUSCULAR | Status: DC | PRN

## 2021-03-27 MED ORDER — PROPOFOL 10 MG/ML IV BOLUS
INTRAVENOUS | Status: DC | PRN
  Administered 2021-03-27 (×2): 20 mg via INTRAVENOUS
  Administered 2021-03-27: 30 mg via INTRAVENOUS
  Administered 2021-03-27 (×3): 20 mg via INTRAVENOUS
  Administered 2021-03-27: 80 mg via INTRAVENOUS
  Administered 2021-03-27: 30 mg via INTRAVENOUS

## 2021-03-27 MED ORDER — ACETAMINOPHEN 160 MG/5ML PO SOLN: 325.0000 mg | ORAL | Status: DC | PRN

## 2021-03-27 MED ORDER — SODIUM CHLORIDE 0.9 % IV SOLN: INTRAVENOUS | Status: DC

## 2021-03-27 MED ORDER — LACTATED RINGERS IV SOLN: INTRAVENOUS | Status: DC

## 2021-03-27 MED ORDER — STERILE WATER FOR IRRIGATION IR SOLN: Status: DC | PRN

## 2021-03-27 SURGICAL SUPPLY — 8 items
GOWN CVR UNV OPN BCK APRN NK (MISCELLANEOUS) ×2 IMPLANT
GOWN ISOL THUMB LOOP REG UNIV (MISCELLANEOUS) ×4
KIT PRC NS LF DISP ENDO (KITS) ×1 IMPLANT
KIT PROCEDURE OLYMPUS (KITS) ×2
MANIFOLD NEPTUNE II (INSTRUMENTS) ×2 IMPLANT
SNARE COLD EXACTO (MISCELLANEOUS) ×2 IMPLANT
TRAP ETRAP POLY (MISCELLANEOUS) ×2 IMPLANT
WATER STERILE IRR 250ML POUR (IV SOLUTION) ×2 IMPLANT

## 2021-03-27 NOTE — H&P (Signed)
Shelby Lame, MD [address] [place], [place] [address] [phone] Fax : [phone]  Primary Care Physician:  Juline Patch, MD Primary Gastroenterologist:  Dr. Allen Norris  Pre-Procedure History & Physical: HPI:  Shelby Gallagher is a 60 y.o. female is here for an colonoscopy.   Past Medical History:  Diagnosis Date   Anxiety    Complication of anesthesia    wakes during procedures   Wears dentures    full upper    Past Surgical History:  Procedure Laterality Date   ABDOMINAL HYSTERECTOMY     AUGMENTATION MAMMAPLASTY Bilateral 2012   CHOLECYSTECTOMY     COLONOSCOPY  2015   repeat in 3 years- Dr Janalyn Rouse- [facility]   TONSILLECTOMY      Prior to Admission medications   Medication Sig Start Date End Date Taking? Authorizing Provider  Sodium Sulfate-Mag Sulfate-KCl (SUTAB) [phone] MG TABS Take 1 bottle (12 pills) at 5:00pm on 03/26/21, then take 2nd bottle (12 pills) 5 hours prior to procedure 03/21/21  Yes Noralyn Karim, MD  meloxicam (MOBIC) 15 MG tablet TAKE 1 TABLET (15 MG TOTAL) BY MOUTH DAILY. Patient not taking: Reported on 03/21/2021 12/07/20   Juline Patch, MD  valACYclovir (VALTREX) 1000 MG tablet Take 2 tablets (2,000 mg total) by mouth 2 (two) times daily. Patient not taking: Reported on 03/21/2021 11/10/20   Juline Patch, MD    Allergies as of 10/31/2020 - Review Complete 10/18/2020  Allergen Reaction Noted   Glucosamine Swelling 10/12/2015   Shellfish allergy Swelling 06/18/2017   Shellfish-derived products  10/12/2015    Family History  Problem Relation Age of Onset   Cancer Mother    Hypertension Mother    Cancer Father    Hypertension Father    Stroke Father    Breast cancer Neg Hx     Social History   Socioeconomic History   Marital status: Single    Spouse name: Not on file   Number of children: Not on file   Years of education: Not on file   Highest education level: Not on file  Occupational History   Not  on file  Tobacco Use   Smoking status: Every Day    Packs/day: 0.75    Years: 35.00    Pack years: 26.25    Types: Cigarettes   Smokeless tobacco: Never   Tobacco comments:    Started smoking early 85s  Vaping Use   Vaping Use: Never used  Substance and Sexual Activity   Alcohol use: Not Currently   Drug use: No   Sexual activity: Yes  Other Topics Concern   Not on file  Social History Narrative   Not on file   Social Determinants of Health   Financial Resource Strain: Not on file  Food Insecurity: Not on file  Transportation Needs: Not on file  Physical Activity: Not on file  Stress: Not on file  Social Connections: Not on file  Intimate Partner Violence: Not on file    Review of Systems: See HPI, otherwise negative ROS  Physical Exam: BP 117/74   Pulse 72   Temp 97.7 F (36.5 C) (Temporal)   Resp 16   Ht '5\' 7"'$  (1.702 m)   Wt 77.1 kg   SpO2 100%   BMI 26.63 kg/m  General:   Alert,  pleasant and cooperative in NAD Head:  Normocephalic and atraumatic. Neck:  Supple; no masses or thyromegaly. Lungs:  Clear throughout to auscultation.    Heart:  Regular rate and rhythm. Abdomen:  Soft, nontender and nondistended. Normal bowel sounds, without guarding, and without rebound.   Neurologic:  Alert and  oriented x4;  grossly normal neurologically.  Impression/Plan: [name] is here for an colonoscopy to be performed for a history of adenomatous polyps on 2013   Risks, benefits, limitations, and alternatives regarding  colonoscopy have been reviewed with the patient.  Questions have been answered.  All parties agreeable.   Shelby Lame, MD  03/27/2021, 7:40 AM

## 2021-03-27 NOTE — Transfer of Care (Signed)
Immediate Anesthesia Transfer of Care Note  Patient: Shelby Gallagher  Procedure(s) Performed: COLONOSCOPY WITH BIOPSY (Rectum) POLYPECTOMY (Rectum)  Patient Location: PACU  Anesthesia Type: General  Level of Consciousness: awake, alert  and patient cooperative  Airway and Oxygen Therapy: Patient Spontanous Breathing and Patient connected to supplemental oxygen  Post-op Assessment: Post-op Vital signs reviewed, Patient's Cardiovascular Status Stable, Respiratory Function Stable, Patent Airway and No signs of Nausea or vomiting  Post-op Vital Signs: Reviewed and stable  Complications: No notable events documented.

## 2021-03-27 NOTE — Op Note (Signed)
[facility] Patient Name: Shelby Gallagher Procedure Date: 03/27/2021 7:34 AM MRN: [id number] Account #: [id number] Date of Birth: 03-02-1961 Admit Type: Outpatient Age: 60 Room: [facility] OR ROOM 01 Gender: Female Note Status: Finalized Procedure:             Colonoscopy Indications:           Screening for colorectal malignant neoplasm, High risk                         colon cancer surveillance: Personal history of colonic                         polyps Providers:             Lucilla Lame MD, MD Referring MD:          Juline Patch, MD (Referring MD) Medicines:             Propofol per Anesthesia Complications:         No immediate complications. Procedure:             Pre-Anesthesia Assessment:                        - Prior to the procedure, a History and Physical was                         performed, and patient medications and allergies were                         reviewed. The patient's tolerance of previous                         anesthesia was also reviewed. The risks and benefits                         of the procedure and the sedation options and risks                         were discussed with the patient. All questions were                         answered, and informed consent was obtained. Prior                         Anticoagulants: The patient has taken no previous                         anticoagulant or antiplatelet agents. ASA Grade                         Assessment: II - A patient with mild systemic disease.                         After reviewing the risks and benefits, the patient                         was deemed in satisfactory condition to undergo the  procedure.                        After obtaining informed consent, the colonoscope was                         passed under direct vision. Throughout the procedure,                         the patient's blood pressure, pulse, and oxygen                          saturations were monitored continuously. The                         Colonoscope was introduced through the anus and                         advanced to the the cecum, identified by appendiceal                         orifice and ileocecal valve. The colonoscopy was                         performed without difficulty. The patient tolerated                         the procedure well. The quality of the bowel                         preparation was good. Findings:      The perianal and digital rectal examinations were normal.      Two sessile polyps were found in the sigmoid colon. The polyps were 3 to       4 mm in size. These polyps were removed with a cold snare. Resection and       retrieval were complete.      A 5 mm polyp was found in the descending colon. The polyp was sessile.       The polyp was removed with a cold snare. Resection and retrieval were       complete. Impression:            - Two 3 to 4 mm polyps in the sigmoid colon, removed                         with a cold snare. Resected and retrieved.                        - One 5 mm polyp in the descending colon, removed with                         a cold snare. Resected and retrieved. Recommendation:        - Discharge patient to home.                        - Resume previous diet.                        - Continue present medications.                        -  Await pathology results.                        - Repeat colonoscopy in 5 years for surveillance. Procedure Code(s):     --- Professional ---                        [phone], Colonoscopy, flexible; with removal of                         tumor(s), polyp(s), or other lesion(s) by snare                         technique Diagnosis Code(s):     --- Professional ---                        Z86.010, Personal history of colonic polyps                        K63.5, Polyp of colon CPT copyright 2019 American Medical Association. All rights reserved. The codes  documented in this report are preliminary and upon coder review may  be revised to meet current compliance requirements. Lucilla Lame MD, MD 03/27/2021 8:07:14 AM This report has been signed electronically. Number of Addenda: 0 Note Initiated On: 03/27/2021 7:34 AM Scope Withdrawal Time: 0 hours 7 minutes 26 seconds  Total Procedure Duration: 0 hours 11 minutes 38 seconds  Estimated Blood Loss:  Estimated blood loss: none.      [facility]

## 2021-03-27 NOTE — Anesthesia Preprocedure Evaluation (Addendum)
Anesthesia Evaluation  Patient identified by MRN, date of birth, ID band Patient awake    Reviewed: Allergy & Precautions, NPO status , Patient's Chart, lab work & pertinent test results  History of Anesthesia Complications (+) PONV, AWARENESS UNDER ANESTHESIA and history of anesthetic complications ("remembers other colonoscopies")  Airway Mallampati: II  TM Distance: >3 FB Neck ROM: Full    Dental no notable dental hx.    Pulmonary Current SmokerPatient did not abstain from smoking.,    Pulmonary exam normal breath sounds clear to auscultation       Cardiovascular Exercise Tolerance: Good hypertension, + angina Normal cardiovascular exam Rhythm:Regular Rate:Normal     Neuro/Psych Anxiety negative neurological ROS     GI/Hepatic negative GI ROS, Neg liver ROS,   Endo/Other  negative endocrine ROS  Renal/GU negative Renal ROS  negative genitourinary   Musculoskeletal negative musculoskeletal ROS (+)   Abdominal Normal abdominal exam  (+) - obese,  Abdomen: soft.    Peds negative pediatric ROS (+)  Hematology negative hematology ROS (+)   Anesthesia Other Findings   Reproductive/Obstetrics negative OB ROS                            Anesthesia Physical Anesthesia Plan  ASA: 3  Anesthesia Plan: General   Post-op Pain Management:    Induction: Intravenous  PONV Risk Score and Plan: 2 and Treatment may vary due to age or medical condition and Propofol infusion  Airway Management Planned: Natural Airway and Nasal Cannula  Additional Equipment:   Intra-op Plan:   Post-operative Plan:   Informed Consent: I have reviewed the patients History and Physical, chart, labs and discussed the procedure including the risks, benefits and alternatives for the proposed anesthesia with the patient or authorized representative who has indicated his/her understanding and acceptance.      Dental advisory given  Plan Discussed with: CRNA  Anesthesia Plan Comments:         Anesthesia Quick Evaluation  Patient Active Problem List   Diagnosis Date Noted  . Status post abdominal hysterectomy 11/23/2020  . Hyperlipidemia, mixed 07/03/2018  . Benign essential HTN 06/18/2017  . Stable angina pectoris ([place]) 06/18/2017  . Current tobacco use 08/23/2014  . Cannot sleep 01/06/2003    CBC Latest Ref Rng & Units 05/09/2018 05/07/2018 09/10/2016  WBC 3.6 - 11.0 K/uL 11.9(H) 9.7 9.5  Hemoglobin 12.0 - 16.0 g/dL 14.9 13.9 14.1  Hematocrit 35.0 - 47.0 % 44.1 39.7 40.9  Platelets 150 - 440 K/uL 233 243 252   BMP Latest Ref Rng & Units 10/18/2020 05/09/2018 05/07/2018  Glucose 65 - 99 mg/dL 82 96 105(H)  BUN 6 - 24 mg/dL '6 9 11  '$ Creatinine 0.57 - 1.00 mg/dL 0.78 0.82 0.86  BUN/Creat Ratio 9 - 23 8(L) - -  Sodium 134 - 144 mmol/L 143 142 140  Potassium 3.5 - 5.2 mmol/L 4.8 3.9 3.6  Chloride 96 - 106 mmol/L 104 109 107  CO2 20 - 29 mmol/L '21 25 24  '$ Calcium 8.7 - 10.2 mg/dL 9.6 9.7 9.4    Risks and benefits of anesthesia discussed at length, patient or surrogate demonstrates understanding. Appropriately NPO. Plan to proceed with anesthesia.  Champ Mungo, MD 03/27/21

## 2021-03-27 NOTE — Anesthesia Postprocedure Evaluation (Signed)
Anesthesia Post Note  Patient: Shelby Gallagher  Procedure(s) Performed: COLONOSCOPY WITH BIOPSY (Rectum) POLYPECTOMY (Rectum)     Patient location during evaluation: PACU Anesthesia Type: General Level of consciousness: awake and alert Pain management: pain level controlled Vital Signs Assessment: post-procedure vital signs reviewed and stable Respiratory status: spontaneous breathing, nonlabored ventilation, respiratory function stable and patient connected to nasal cannula oxygen Cardiovascular status: stable and blood pressure returned to baseline Postop Assessment: no apparent nausea or vomiting Anesthetic complications: no   No notable events documented.  Sinda Du

## 2021-03-27 NOTE — Anesthesia Procedure Notes (Signed)
Date/Time: 03/27/2021 7:51 AM Performed by: Mayme Genta, CRNA Pre-anesthesia Checklist: Patient identified, Emergency Drugs available, Suction available, Timeout performed and Patient being monitored Patient Re-evaluated:Patient Re-evaluated prior to induction Oxygen Delivery Method: Nasal cannula Placement Confirmation: positive ETCO2

## 2021-03-28 ENCOUNTER — Encounter: Payer: Self-pay | Admitting: Gastroenterology

## 2021-03-28 LAB — SURGICAL PATHOLOGY

## 2021-10-10 ENCOUNTER — Telehealth: Payer: Self-pay

## 2021-10-10 NOTE — Telephone Encounter (Signed)
Copied from [place] [id number]. Topic: General - Other >> Oct 10, 2021  3:37 PM McGill, Nelva Bush wrote: Reason for [id number] from Rudene Christians is asking if the fax received 07/26/2021 was returned.  Requesting a call back to [facility] - [phone] [facility] medical records.  REF SITE [id number]

## 2021-10-25 ENCOUNTER — Encounter: Payer: 59 | Admitting: Family Medicine

## 2021-11-14 ENCOUNTER — Ambulatory Visit
Admission: RE | Admit: 2021-11-14 | Discharge: 2021-11-14 | Disposition: A | Discharge status: Home / Self Care | Payer: 59 | Attending: Family Medicine | Admitting: Family Medicine

## 2021-11-14 ENCOUNTER — Telehealth: Payer: Self-pay

## 2021-11-14 ENCOUNTER — Encounter: Payer: Self-pay | Admitting: Family Medicine

## 2021-11-14 ENCOUNTER — Ambulatory Visit
Admission: RE | Admit: 2021-11-14 | Discharge: 2021-11-14 | Disposition: A | Discharge status: Home / Self Care | Payer: 59 | Source: Ambulatory Visit | Attending: Family Medicine | Admitting: Family Medicine

## 2021-11-14 ENCOUNTER — Ambulatory Visit (INDEPENDENT_AMBULATORY_CARE_PROVIDER_SITE_OTHER): Payer: 59 | Admitting: Family Medicine

## 2021-11-14 ENCOUNTER — Other Ambulatory Visit: Payer: Self-pay

## 2021-11-14 VITALS — BP 130/80 | HR 78 | Ht 67.0 in | Wt 179.0 lb

## 2021-11-14 DIAGNOSIS — M25551 Pain in right hip: Secondary | ICD-10-CM | POA: Diagnosis present

## 2021-11-14 DIAGNOSIS — H8309 Labyrinthitis, unspecified ear: Secondary | ICD-10-CM | POA: Diagnosis not present

## 2021-11-14 DIAGNOSIS — J0100 Acute maxillary sinusitis, unspecified: Secondary | ICD-10-CM | POA: Diagnosis not present

## 2021-11-14 DIAGNOSIS — H6592 Unspecified nonsuppurative otitis media, left ear: Secondary | ICD-10-CM | POA: Diagnosis not present

## 2021-11-14 MED ORDER — SCOPOLAMINE 1 MG/3DAYS TD PT72: 1.0000 | MEDICATED_PATCH | TRANSDERMAL | 12 refills | Status: AC

## 2021-11-14 MED ORDER — AZITHROMYCIN 250 MG PO TABS: ORAL_TABLET | ORAL | 0 refills | Status: AC

## 2021-11-14 MED ORDER — MELOXICAM 7.5 MG PO TABS: 7.5000 mg | ORAL_TABLET | Freq: Every day | ORAL | 0 refills | Status: DC

## 2021-11-14 NOTE — Telephone Encounter (Signed)
Called in scopolamine patches for pt #4 with 0 refills to [facility]. ?

## 2021-11-14 NOTE — Progress Notes (Signed)
? ? ?Date:  11/14/2021  ? ?Name:  Shelby Gallagher   DOB:  [birth year]   MRN:  [id number] ? ? ?Chief Complaint: Sore Throat (Sore throat, dry cough, low grade fever, no facial tenderness) and Hip Pain (R) hip pain- hurts worse at night) ? ?Sore Throat  ?This is a new problem. The problem has been gradually worsening. Neither side of throat is experiencing more pain than the other. The pain is moderate. Associated symptoms include congestion and coughing. Pertinent negatives include no abdominal pain, diarrhea, drooling, ear discharge, ear pain, headaches, hoarse voice, neck pain or shortness of breath. She has had no exposure to strep or mono.  ?Hip Pain  ?The incident occurred more than 1 week ago. There was no injury mechanism. The pain is present in the right hip. The quality of the pain is described as aching. The pain is at a severity of 8/10. The pain is moderate. The pain has been Intermittent since onset. The symptoms are aggravated by palpation, weight bearing and movement. She has tried acetaminophen for the symptoms. The treatment provided no relief.  ?Sinusitis ?This is a new problem. The current episode started in the past 7 days. The problem has been gradually worsening since onset. There has been no fever. The pain is mild. Associated symptoms include congestion, coughing, sinus pressure and a sore throat. Pertinent negatives include no chills, ear pain, headaches, hoarse voice, neck pain or shortness of breath.  ? ?Lab Results  ?Component Value Date  ? NA 143 10/18/2020  ? K 4.8 10/18/2020  ? CO2 21 10/18/2020  ? GLUCOSE 82 10/18/2020  ? BUN 6 10/18/2020  ? CREATININE 0.78 10/18/2020  ? CALCIUM 9.6 10/18/2020  ? EGFR 89 10/18/2020  ? GFRNONAA >60 05/09/2018  ? ?Lab Results  ?Component Value Date  ? CHOL 213 (H) 10/18/2020  ? HDL 50 10/18/2020  ? LDLCALC 148 (H) 10/18/2020  ? TRIG 82 10/18/2020  ? ?No results found for: TSH ?No results found for: HGBA1C ?Lab Results  ?Component Value Date  ? WBC  11.9 (H) 05/09/2018  ? HGB 14.9 05/09/2018  ? HCT 44.1 05/09/2018  ? MCV 86.1 05/09/2018  ? PLT 233 05/09/2018  ? ?Lab Results  ?Component Value Date  ? ALT 14 10/18/2020  ? AST 20 10/18/2020  ? ALKPHOS 169 (H) 10/18/2020  ? BILITOT 0.4 10/18/2020  ? ?No results found for: 25OHVITD2, [place], VD25OH  ? ?Review of Systems  ?Constitutional:  Negative for chills and fever.  ?HENT:  Positive for congestion, sinus pressure and sore throat. Negative for drooling, ear discharge, ear pain and hoarse voice.   ?Respiratory:  Positive for cough. Negative for shortness of breath and wheezing.   ?Cardiovascular:  Negative for chest pain, palpitations and leg swelling.  ?Gastrointestinal:  Negative for abdominal pain, blood in stool, constipation, diarrhea and nausea.  ?Endocrine: Negative for polydipsia.  ?Genitourinary:  Negative for dysuria, frequency, hematuria and urgency.  ?Musculoskeletal:  Negative for back pain, myalgias and neck pain.  ?Skin:  Negative for rash.  ?Allergic/Immunologic: Negative for environmental allergies.  ?Neurological:  Negative for dizziness and headaches.  ?Hematological:  Does not bruise/bleed easily.  ?Psychiatric/Behavioral:  Negative for suicidal ideas. The patient is not nervous/anxious.   ? ?Patient Active Problem List  ? Diagnosis Date Noted  ? Hx of colonic polyps   ? Polyp of descending colon   ? Status post abdominal hysterectomy 11/23/2020  ? Hyperlipidemia, mixed 07/03/2018  ? Benign essential HTN 06/18/2017  ?  Stable angina pectoris ([place]) 06/18/2017  ? Current tobacco use 08/23/2014  ? Cannot sleep 01/06/2003  ? ? ?Allergies  ?Allergen Reactions  ? Glucosamine Swelling  ?  Other reaction(s): SWELLING  ? Shellfish Allergy Swelling  ? Shellfish-Derived Products   ?  Other reaction(s): SWELLING  ? ? ?Past Surgical History:  ?Procedure Laterality Date  ? ABDOMINAL HYSTERECTOMY    ? AUGMENTATION MAMMAPLASTY Bilateral 2012  ? CHOLECYSTECTOMY    ? COLONOSCOPY  2015  ? repeat in 3 years- Dr  Janalyn Rouse- [place]  ? COLONOSCOPY WITH PROPOFOL N/A 03/27/2021  ? Procedure: COLONOSCOPY WITH BIOPSY;  Surgeon: Lucilla Lame, MD;  Location: [place];  Service: Endoscopy;  Laterality: N/A;  ? POLYPECTOMY N/A 03/27/2021  ? Procedure: POLYPECTOMY;  Surgeon: Lucilla Lame, MD;  Location: [place];  Service: Endoscopy;  Laterality: N/A;  ? TONSILLECTOMY    ? ? ?Social History  ? ?Tobacco Use  ? Smoking status: Every Day  ?  Packs/day: 0.75  ?  Years: 35.00  ?  Pack years: 26.25  ?  Types: Cigarettes  ? Smokeless tobacco: Never  ? Tobacco comments:  ?  Started smoking early 66s  ?Vaping Use  ? Vaping Use: Never used  ?Substance Use Topics  ? Alcohol use: Not Currently  ? Drug use: No  ? ? ? ?Medication list has been reviewed and updated. ? ?No outpatient medications have been marked as taking for the 11/14/21 encounter (Office Visit) with Juline Patch, MD.  ? ? ? ?  11/14/2021  ? 10:13 AM  ?GAD 7 : Generalized Anxiety Score  ?Nervous, Anxious, on Edge 0  ?Control/stop worrying 0  ?Worry too much - different things 0  ?Trouble relaxing 0  ?Restless 0  ?Easily annoyed or irritable 0  ?Afraid - awful might happen 0  ?Total GAD 7 Score 0  ?Anxiety Difficulty Not difficult at all  ? ? ? ?  11/14/2021  ? 10:12 AM  ?Depression screen PHQ 2/9  ?Decreased Interest 0  ?Down, Depressed, Hopeless 0  ?PHQ - 2 Score 0  ?Altered sleeping 0  ?Tired, decreased energy 0  ?Change in appetite 0  ?Feeling bad or failure about yourself  0  ?Trouble concentrating 0  ?Moving slowly or fidgety/restless 0  ?Suicidal thoughts 0  ?PHQ-9 Score 0  ?Difficult doing work/chores Not difficult at all  ? ? ?BP Readings from Last 3 Encounters:  ?11/14/21 130/80  ?03/27/21 (!) 90/59  ?11/10/20 130/88  ? ? ?Physical Exam ?Vitals and nursing note reviewed. Exam conducted with a chaperone present.  ?Constitutional:   ?   General: She is not in acute distress. ?   Appearance: She is not diaphoretic.  ?HENT:  ?   Head: Normocephalic and atraumatic.   ?   Right Ear: Tympanic membrane, ear canal and external ear normal. No drainage or swelling.  ?   Left Ear: Ear canal and external ear normal. No drainage or swelling. A middle ear effusion is present.  ?   Nose: Nose normal.  ?Eyes:  ?   General:     ?   Right eye: No discharge.     ?   Left eye: No discharge.  ?   Conjunctiva/sclera: Conjunctivae normal.  ?   Pupils: Pupils are equal, round, and reactive to light.  ?Neck:  ?   Thyroid: No thyromegaly.  ?   Vascular: No JVD.  ?Cardiovascular:  ?   Rate and Rhythm: Normal rate and regular rhythm.  ?  Heart sounds: Normal heart sounds. No murmur heard. ?  No friction rub. No gallop.  ?Pulmonary:  ?   Effort: Pulmonary effort is normal.  ?   Breath sounds: Normal breath sounds.  ?Abdominal:  ?   General: Bowel sounds are normal.  ?   Palpations: Abdomen is soft. There is no mass.  ?   Tenderness: There is no abdominal tenderness. There is no guarding.  ?Musculoskeletal:     ?   General: Normal range of motion.  ?   Cervical back: Normal range of motion and neck supple.  ?Lymphadenopathy:  ?   Cervical: No cervical adenopathy.  ?Skin: ?   General: Skin is warm and dry.  ?Neurological:  ?   Mental Status: She is alert.  ?   Deep Tendon Reflexes: Reflexes are normal and symmetric.  ? ? ?Wt Readings from Last 3 Encounters:  ?11/14/21 179 lb (81.2 kg)  ?03/27/21 170 lb (77.1 kg)  ?11/10/20 170 lb (77.1 kg)  ? ? ?BP 130/80   Pulse 78   Ht $R'5\' 7"'ag$  (1.702 m)   Wt 179 lb (81.2 kg)   BMI 28.04 kg/m?  ? ?Assessment and Plan: ? ?1. Pain of right hip ?New onset.  Persistent.  Onset 3 weeks ago.  Pain on the lateral aspect of the right hip superior to the trochanter bursa.  There is tenderness to palpation and pain with movement and with standing.  We will obtain x-ray of the hip and we will initiate meloxicam 7.51 with a second that may be taken within the 12-hour.  If pain continues.  Sports medicine appointment has been made for evaluation and treatment thereupon. ?- DG Hip  Unilat W OR W/O Pelvis 2-3 Views Right; Future ?- meloxicam (MOBIC) 7.5 MG tablet; Take 1 tablet (7.5 mg total) by mouth daily.  Dispense: 30 tablet; Refill: 0 ? ?2. Labyrinthitis, unspecified lateralit

## 2021-11-20 ENCOUNTER — Encounter: Payer: Self-pay | Admitting: Family Medicine

## 2021-11-20 ENCOUNTER — Other Ambulatory Visit: Payer: Self-pay

## 2021-11-20 ENCOUNTER — Ambulatory Visit (INDEPENDENT_AMBULATORY_CARE_PROVIDER_SITE_OTHER): Payer: 59 | Admitting: Family Medicine

## 2021-11-20 VITALS — BP 128/88 | HR 82 | Ht 67.0 in | Wt 179.2 lb

## 2021-11-20 DIAGNOSIS — J0100 Acute maxillary sinusitis, unspecified: Secondary | ICD-10-CM

## 2021-11-20 DIAGNOSIS — M25551 Pain in right hip: Secondary | ICD-10-CM

## 2021-11-20 MED ORDER — PREDNISONE 50 MG PO TABS: 50.0000 mg | ORAL_TABLET | Freq: Every day | ORAL | 0 refills | Status: DC

## 2021-11-20 MED ORDER — DICLOFENAC SODIUM 75 MG PO TBEC: 75.0000 mg | DELAYED_RELEASE_TABLET | Freq: Two times a day (BID) | ORAL | 0 refills | Status: DC

## 2021-11-20 MED ORDER — AMOXICILLIN-POT CLAVULANATE 875-125 MG PO TABS
1.0000 | ORAL_TABLET | Freq: Two times a day (BID) | ORAL | 0 refills | Status: DC
Start: 2021-11-20 — End: 2022-01-11

## 2021-11-20 NOTE — Assessment & Plan Note (Signed)
Patient presents for evaluation of acute on chronic right posterolateral hip pain.  She states that she has been symptomatic for roughly 1 month, denies any trauma or overuse at onset.  Pain is posterior lateral hip, mild radiation distally, denies any radiation past the knee, no paresthesias.  She has had prior episodes in the past which have usually been self-limited, onset after increased physical activity.  Treatment wise she was started on meloxicam at which she self titrated to 2 tablets daily, initially effective, has since been ineffective. ? ?Examination reveals tight posterior chain structures, positive FADIR with mild pain localizing about the groin, resisted straight leg raise elicits mild anteromedial thigh pain which is not representative of her stated symptoms, negative straight leg raise, positive tenderness at the right greater trochanter which reproduces her stated symptoms, tenderness at the gluteus medius/minimus tendons and certainly, positive Faber, equivocal piriformis, nontender SI joint, Kemps test negative bilaterally. ? ?Patient's clinical history and findings today are most consistent with right greater trochanteric pain syndrome, x-rays demonstrate an element of chronicity.  These were reviewed with the patient as well as treatment strategies, plan for scheduled course of prednisone followed by diclofenac, and close follow-up in 2 weeks.  If suboptimal progress noted, anticipate ultrasound-guided corticosteroid injection.  Once adequately improved, home-based versus formal PT to commence. ? ?Chronic condition, exacerbation, independent interpretation x-rays, Rx management ?

## 2021-11-20 NOTE — Progress Notes (Signed)
?  ? ?Primary Care / Sports Medicine Office Visit ? ?Patient Information:  ?Patient ID: Shelby Gallagher, female DOB: 08-27-1960 Age: 61 y.o. MRN: [id number]  ? ?Shelby Gallagher is a pleasant 61 y.o. female presenting with the following: ? ?Chief Complaint  ?Patient presents with  ? Hip Pain  ?  Right, for couple months, tylenol no help, meloxicam isn't working anymore.   ? ? ?Vitals:  ? 11/20/21 1520  ?BP: 128/88  ?Pulse: 82  ?SpO2: 98%  ? ?Vitals:  ? 11/20/21 1520  ?Weight: 179 lb 3.2 oz (81.3 kg)  ?Height: '5\' 7"'$  (1.702 m)  ? ?Body mass index is 28.07 kg/m?. ? ?DG Hip Unilat W OR W/O Pelvis 2-3 Views Right ? ?Result Date: 11/15/2021 ?CLINICAL DATA:  Right hip pain for 3 weeks EXAM: DG HIP (WITH OR WITHOUT PELVIS) 2-3V RIGHT COMPARISON:  None. FINDINGS: Frontal view of the pelvis as well as frontal and frogleg lateral views of the right hip are obtained. No acute fracture, subluxation, or dislocation. Joint spaces are relatively well preserved. Sacroiliac joints are unremarkable. Soft tissues are normal. IMPRESSION: 1. Unremarkable pelvis and right hip. Electronically Signed   By: Randa Ngo M.D.   On: 11/15/2021 08:26    ? ?Independent interpretation of notes and tests performed by another provider:  ? ?Independent interpretation of right hip x-rays from 11/15/2021 reveals overall preserved right hip joint with minimal degenerative changes, there is cortical roughening and irregularities at the right ASIS as well as bilateral, right greater than left greater trochanters consistent with degenerative changes, no acute osseous processes noted. ? ?Procedures performed:  ? ?None ? ?Pertinent History, Exam, Impression, and Recommendations:  ? ?Greater trochanteric pain syndrome of right lower extremity ?Patient presents for evaluation of acute on chronic right posterolateral hip pain.  She states that she has been symptomatic for roughly 1 month, denies any trauma or overuse at onset.  Pain is posterior  lateral hip, mild radiation distally, denies any radiation past the knee, no paresthesias.  She has had prior episodes in the past which have usually been self-limited, onset after increased physical activity.  Treatment wise she was started on meloxicam at which she self titrated to 2 tablets daily, initially effective, has since been ineffective. ? ?Examination reveals tight posterior chain structures, positive FADIR with mild pain localizing about the groin, resisted straight leg raise elicits mild anteromedial thigh pain which is not representative of her stated symptoms, negative straight leg raise, positive tenderness at the right greater trochanter which reproduces her stated symptoms, tenderness at the gluteus medius/minimus tendons and certainly, positive Faber, equivocal piriformis, nontender SI joint, Kemps test negative bilaterally. ? ?Patient's clinical history and findings today are most consistent with right greater trochanteric pain syndrome, x-rays demonstrate an element of chronicity.  These were reviewed with the patient as well as treatment strategies, plan for scheduled course of prednisone followed by diclofenac, and close follow-up in 2 weeks.  If suboptimal progress noted, anticipate ultrasound-guided corticosteroid injection.  Once adequately improved, home-based versus formal PT to commence. ? ?Chronic condition, exacerbation, independent interpretation x-rays, Rx management  ? ?Orders & Medications ?Meds ordered this encounter  ?Medications  ? predniSONE (DELTASONE) 50 MG tablet  ?  Sig: Take 1 tablet (50 mg total) by mouth daily.  ?  Dispense:  5 tablet  ?  Refill:  0  ? diclofenac (VOLTAREN) 75 MG EC tablet  ?  Sig: Take 1 tablet (75 mg total) by mouth 2 (two) times  daily.  ?  Dispense:  30 tablet  ?  Refill:  0  ? ?No orders of the defined types were placed in this encounter. ?  ? ?Return in about 2 weeks (around 12/04/2021).  ?  ? ?Montel Culver, MD ? ? Primary Care Sports  Medicine ?[facility] ?[place]  ? ?

## 2021-11-20 NOTE — Patient Instructions (Signed)
-   Start prednisone, take for full 5-day course ?- After prednisone complete, start diclofenac, dose twice daily with food until follow-up ?- Return for follow-up in 2 weeks ?- Contact us for any questions/concerns between now and then ?

## 2021-11-21 ENCOUNTER — Ambulatory Visit: Payer: 59 | Admitting: Family Medicine

## 2021-11-22 ENCOUNTER — Ambulatory Visit: Payer: 59 | Admitting: Family Medicine

## 2021-12-11 ENCOUNTER — Other Ambulatory Visit: Payer: Self-pay | Admitting: Family Medicine

## 2021-12-11 DIAGNOSIS — M25551 Pain in right hip: Secondary | ICD-10-CM

## 2022-01-10 ENCOUNTER — Telehealth: Payer: Self-pay | Admitting: Family Medicine

## 2022-01-10 NOTE — Telephone Encounter (Signed)
Spoke to pt she wanted to get a prescriptions today due to her husband seeing Dr. Army Melia today 01/10/22 and they have the same symptoms. Pt has an appt tomorrow and will keep the appt. Told pt she needs to be seen before and medication can be sent in. Pt verbalized understanding.  KP

## 2022-01-10 NOTE — Telephone Encounter (Signed)
Copied from [place] [id number]. Topic: General - Other >> Jan 10, 2022 12:19 PM Valere Dross wrote: Reason for CRM: Pt called in requesting if Baxter Flattery could give her a call back in concerns to her appt, please advise.

## 2022-01-11 ENCOUNTER — Encounter: Payer: Self-pay | Admitting: Family Medicine

## 2022-01-11 ENCOUNTER — Ambulatory Visit (INDEPENDENT_AMBULATORY_CARE_PROVIDER_SITE_OTHER): Payer: 59 | Admitting: Family Medicine

## 2022-01-11 VITALS — BP 106/74 | HR 93 | Temp 97.9°F | Ht 67.0 in | Wt 174.0 lb

## 2022-01-11 DIAGNOSIS — J0100 Acute maxillary sinusitis, unspecified: Secondary | ICD-10-CM

## 2022-01-11 DIAGNOSIS — J4521 Mild intermittent asthma with (acute) exacerbation: Secondary | ICD-10-CM | POA: Diagnosis not present

## 2022-01-11 MED ORDER — PREDNISONE 10 MG PO TABS: 10.0000 mg | ORAL_TABLET | Freq: Every day | ORAL | 0 refills | Status: DC

## 2022-01-11 MED ORDER — GUAIFENESIN-CODEINE 100-10 MG/5ML PO SYRP: 5.0000 mL | ORAL_SOLUTION | Freq: Three times a day (TID) | ORAL | 0 refills | Status: DC | PRN

## 2022-01-11 MED ORDER — ALBUTEROL SULFATE HFA 108 (90 BASE) MCG/ACT IN AERS: 2.0000 | INHALATION_SPRAY | Freq: Four times a day (QID) | RESPIRATORY_TRACT | 2 refills | Status: AC | PRN

## 2022-01-11 MED ORDER — AZITHROMYCIN 250 MG PO TABS: ORAL_TABLET | ORAL | 0 refills | Status: AC

## 2022-01-11 NOTE — Progress Notes (Signed)
Date:  01/11/2022   Name:  Shelby Gallagher   DOB:  08/12/1961   MRN:  [id number]   Chief Complaint: Cough (X 8 days, Congestion, headache, no fever, clear mucous but hard to cough up, tried OTC cough syrup, did not help, dry cough, not sleeping well in bed has to use recliner, has taken amoxi., leftover from dental work, Did not help, took for 7 days 2 times a day )  Cough This is a new problem. The current episode started in the past 7 days. The problem has been unchanged. The problem occurs every few minutes. The cough is Productive of sputum. Associated symptoms include nasal congestion, a sore throat and wheezing. Pertinent negatives include no chest pain, chills, ear congestion, ear pain, fever, headaches, hemoptysis, postnasal drip, shortness of breath or sweats. The symptoms are aggravated by lying down.  Sinusitis The current episode started in the past 7 days. The problem has been gradually improving since onset. There has been no fever. The pain is mild. Associated symptoms include coughing, sinus pressure and a sore throat. Pertinent negatives include no chills, ear pain, headaches or shortness of breath.   Lab Results  Component Value Date   NA 143 10/18/2020   K 4.8 10/18/2020   CO2 21 10/18/2020   GLUCOSE 82 10/18/2020   BUN 6 10/18/2020   CREATININE 0.78 10/18/2020   CALCIUM 9.6 10/18/2020   EGFR 89 10/18/2020   GFRNONAA >60 05/09/2018   Lab Results  Component Value Date   CHOL 213 (H) 10/18/2020   HDL 50 10/18/2020   LDLCALC 148 (H) 10/18/2020   TRIG 82 10/18/2020   No results found for: TSH No results found for: HGBA1C Lab Results  Component Value Date   WBC 11.9 (H) 05/09/2018   HGB 14.9 05/09/2018   HCT 44.1 05/09/2018   MCV 86.1 05/09/2018   PLT 233 05/09/2018   Lab Results  Component Value Date   ALT 14 10/18/2020   AST 20 10/18/2020   ALKPHOS 169 (H) 10/18/2020   BILITOT 0.4 10/18/2020   No results found for: 25OHVITD2, 25OHVITD3,  VD25OH   Review of Systems  Constitutional:  Negative for chills and fever.  HENT:  Positive for sinus pressure and sore throat. Negative for ear pain and postnasal drip.   Respiratory:  Positive for cough and wheezing. Negative for hemoptysis and shortness of breath.   Cardiovascular:  Negative for chest pain.  Neurological:  Negative for headaches.   Patient Active Problem List   Diagnosis Date Noted   Greater trochanteric pain syndrome of right lower extremity 11/20/2021   Hx of colonic polyps    Polyp of descending colon    Status post abdominal hysterectomy 11/23/2020   Hyperlipidemia, mixed 07/03/2018   Benign essential HTN 06/18/2017   Stable angina pectoris ([place]) 06/18/2017   Current tobacco use 08/23/2014   Cannot sleep 01/06/2003    Allergies  Allergen Reactions   Glucosamine Swelling    Other reaction(s): SWELLING   Shellfish Allergy Swelling   Shellfish-Derived Products     Other reaction(s): SWELLING    Past Surgical History:  Procedure Laterality Date   ABDOMINAL HYSTERECTOMY     AUGMENTATION MAMMAPLASTY Bilateral 2012   CHOLECYSTECTOMY     COLONOSCOPY  2015   repeat in 3 years- Dr Janalyn Rouse- [place] PROPOFOL N/A 03/27/2021   Procedure: COLONOSCOPY WITH BIOPSY;  Surgeon: Lucilla Lame, MD;  Location: [facility];  Service: Endoscopy;  Laterality: N/A;  POLYPECTOMY N/A 03/27/2021   Procedure: POLYPECTOMY;  Surgeon: Lucilla Lame, MD;  Location: [facility];  Service: Endoscopy;  Laterality: N/A;   TONSILLECTOMY      Social History   Tobacco Use   Smoking status: Every Day    Packs/day: 0.75    Years: 35.00    Pack years: 26.25    Types: Cigarettes   Smokeless tobacco: Never   Tobacco comments:    Started smoking early 12s  Vaping Use   Vaping Use: Never used  Substance Use Topics   Alcohol use: Not Currently   Drug use: No     Medication list has been reviewed and updated.  Current Meds  Medication Sig    AMOXICILLIN PO Take by mouth.   scopolamine (TRANSDERM-SCOP) 1 MG/3DAYS Place 1 patch (1.5 mg total) onto the skin every 3 (three) days.       01/11/2022   11:07 AM 11/20/2021    3:27 PM 11/14/2021   10:13 AM  GAD 7 : Generalized Anxiety Score  Nervous, Anxious, on Edge 0 0 0  Control/stop worrying 0 0 0  Worry too much - different things 0 0 0  Trouble relaxing 0 0 0  Restless 0 0 0  Easily annoyed or irritable 0 0 0  Afraid - awful might happen 0 0 0  Total GAD 7 Score 0 0 0  Anxiety Difficulty Not difficult at all Not difficult at all Not difficult at all       01/11/2022   11:06 AM  Depression screen PHQ 2/9  Decreased Interest 0  Down, Depressed, Hopeless 0  PHQ - 2 Score 0  Altered sleeping 2  Tired, decreased energy 1  Change in appetite 0  Feeling bad or failure about yourself  0  Trouble concentrating 0  Moving slowly or fidgety/restless 0  Suicidal thoughts 0  PHQ-9 Score 3  Difficult doing work/chores Not difficult at all    BP Readings from Last 3 Encounters:  01/11/22 106/74  11/20/21 128/88  11/14/21 130/80    Physical Exam Vitals reviewed.  HENT:     Right Ear: Tympanic membrane normal.     Left Ear: Tympanic membrane normal.     Nose:     Right Turbinates: Not enlarged.     Left Turbinates: Not enlarged.     Right Sinus: Maxillary sinus tenderness present. No frontal sinus tenderness.     Left Sinus: Maxillary sinus tenderness present. No frontal sinus tenderness.  Pulmonary:     Breath sounds: Wheezing present. No decreased breath sounds, rhonchi or rales.  Neurological:     Mental Status: She is alert.    Wt Readings from Last 3 Encounters:  01/11/22 174 lb (78.9 kg)  11/20/21 179 lb 3.2 oz (81.3 kg)  11/14/21 179 lb (81.2 kg)    BP 106/74   Pulse 93   Temp 97.9 F (36.6 C) (Oral)   Ht $R'5\' 7"'ia$  (1.702 m)   Wt 174 lb (78.9 kg)   SpO2 96%   BMI 27.25 kg/m   Assessment and Plan:  1. Acute maxillary sinusitis, recurrence not  specified New onset.  Persistent.  Stable.  Will initiate azithromycin to 50 mg 2 today followed by 1 today for 4 days. - azithromycin (ZITHROMAX) 250 MG tablet; Take 2 tablets on day 1, then 1 tablet daily on days 2 through 5  Dispense: 6 tablet; Refill: 0  2. Mild intermittent reactive airway disease with acute exacerbation Chronic.  Controlled.  Stable.  Generalized wheezing noted.  Will treat with albuterol inhaler and prednisone once a day for 2 weeks.  Patient has been given Robitussin-AC for persistent cough. - albuterol (VENTOLIN HFA) 108 (90 Base) MCG/ACT inhaler; Inhale 2 puffs into the lungs every 6 (six) hours as needed for wheezing or shortness of breath.  Dispense: 8 g; Refill: 2 - guaiFENesin-codeine (ROBITUSSIN AC) 100-10 MG/5ML syrup; Take 5 mLs by mouth 3 (three) times daily as needed for cough.  Dispense: 118 mL; Refill: 0 - predniSONE (DELTASONE) 10 MG tablet; Take 1 tablet (10 mg total) by mouth daily with breakfast.  Dispense: 30 tablet; Refill: 0

## 2022-01-22 ENCOUNTER — Ambulatory Visit
Admission: RE | Admit: 2022-01-22 | Discharge: 2022-01-22 | Disposition: A | Discharge status: Home / Self Care | Payer: 59 | Source: Ambulatory Visit | Attending: Family Medicine | Admitting: Family Medicine

## 2022-01-22 ENCOUNTER — Ambulatory Visit (INDEPENDENT_AMBULATORY_CARE_PROVIDER_SITE_OTHER): Payer: 59 | Admitting: Family Medicine

## 2022-01-22 ENCOUNTER — Encounter: Payer: Self-pay | Admitting: Family Medicine

## 2022-01-22 ENCOUNTER — Ambulatory Visit
Admission: RE | Admit: 2022-01-22 | Discharge: 2022-01-22 | Disposition: A | Discharge status: Home / Self Care | Payer: 59 | Attending: Family Medicine | Admitting: Family Medicine

## 2022-01-22 ENCOUNTER — Ambulatory Visit: Payer: Self-pay | Admitting: *Deleted

## 2022-01-22 VITALS — BP 140/82 | HR 78 | Temp 98.3°F | Ht 67.0 in | Wt 180.0 lb

## 2022-01-22 DIAGNOSIS — R051 Acute cough: Secondary | ICD-10-CM

## 2022-01-22 DIAGNOSIS — J189 Pneumonia, unspecified organism: Secondary | ICD-10-CM | POA: Diagnosis not present

## 2022-01-22 DIAGNOSIS — R0602 Shortness of breath: Secondary | ICD-10-CM

## 2022-01-22 DIAGNOSIS — J432 Centrilobular emphysema: Secondary | ICD-10-CM

## 2022-01-22 LAB — POC COVID19 BINAXNOW: SARS Coronavirus 2 Ag: NEGATIVE

## 2022-01-22 MED ORDER — LEVOFLOXACIN 500 MG PO TABS: 500.0000 mg | ORAL_TABLET | Freq: Every day | ORAL | 0 refills | Status: AC

## 2022-01-22 MED ORDER — GUAIFENESIN-CODEINE 100-10 MG/5ML PO SYRP
5.0000 mL | ORAL_SOLUTION | Freq: Three times a day (TID) | ORAL | 0 refills | Status: DC | PRN
Start: 2022-01-22 — End: 2022-03-31

## 2022-01-22 NOTE — Telephone Encounter (Signed)
Second attempt to reach patient- left message to call office °

## 2022-01-22 NOTE — Telephone Encounter (Signed)
Summary: cough,congestion/requesting medication   Pt stated antibiotics are not working. Pt is requesting a stronger antibiotic.   Pt stated that he has a cough and congestion, and that nose is stuffed up.   Declined appointments.   Seeking clinical advice.     Current Medication list: albuterol, guaifenesin-codeine syrup, prednisone. Azithromycin- 6/1 diagnosed acute maxillary sinusitis  Attempted to call patient- left message to call office

## 2022-01-22 NOTE — Telephone Encounter (Signed)
Third attempt to reach patient- left message to call office.

## 2022-01-22 NOTE — Telephone Encounter (Signed)
Patient informed she needs to come in.

## 2022-01-22 NOTE — Telephone Encounter (Signed)
  Chief Complaint: Medication needs Symptoms: Cough, clear phlegm Frequency: 5 weeks Pertinent Negatives: Patient denies fever Disposition: '[]'$ ED /'[]'$ Urgent Care (no appt availability in office) / '[]'$ Appointment(In office/virtual)/ '[]'$  [facility]/ '[]'$ Home Care/ '[]'$ Refused Recommended Disposition /'[]'$ [facility]/ '[x]'$  Follow-up with PCP Additional Notes: Pt was seen 01/11/2022 and given z-pac, inhaler, cough syrup and 30 day supply of prednisone.  Pt is feeling no better. PT dose not want to come into the office. Pt wants a stronger antibiotic, an inhaler, a better, stronger cough syrup and more prednisone if appropriate.  (PT was told to take 20 days of pred. She resumed taking the pred today.)  Pt would like to have all of these called into [facility] at [address] [place], [place]  Reason for Disposition . [1] Prescription refill request for ESSENTIAL medicine (i.e., likelihood of harm to patient if not taken) AND [2] triager unable to refill per department policy  Answer Assessment - Initial Assessment Questions 1. DRUG NAME: "What medicine do you need to have refilled?"     Antibiotic, stronger cough syrup, Albuterol and prednisone 2. REFILLS REMAINING: "How many refills are remaining?" (Note: The label on the medicine or pill bottle will show how many refills are remaining. If there are no refills remaining, then a renewal may be needed.)     None - has some prednisone left 3. EXPIRATION DATE: "What is the expiration date?" (Note: The label states when the prescription will expire, and thus can no longer be refilled.)      4. PRESCRIBING HCP: "Who prescribed it?" Reason: If prescribed by specialist, call should be referred to that group.     5. SYMPTOMS: "Do you have any symptoms?"     Cough 6. PREGNANCY: "Is there any chance that you are pregnant?" "When was your last menstrual period?"     na  Protocols used: Medication Refill and Renewal Call-A-AH

## 2022-01-22 NOTE — Progress Notes (Signed)
Date:  01/22/2022   Name:  Shelby Gallagher   DOB:  [birth year]   MRN:  [id number]   Chief Complaint: No chief complaint on file.  Cough This is a recurrent problem. The current episode started in the past 7 days (7 days). The problem has been waxing and waning. The problem occurs every few minutes. The cough is Non-productive. Associated symptoms include shortness of breath, weight loss and wheezing. Pertinent negatives include no chest pain, chills, ear pain, fever, headaches, hemoptysis, myalgias, rash or sore throat. The symptoms are aggravated by lying down. Risk factors for lung disease include smoking/tobacco exposure. She has tried oral steroids, prescription cough suppressant and a beta-agonist inhaler for the symptoms. The treatment provided moderate relief. Her past medical history is significant for COPD and emphysema. There is no history of environmental allergies.    Lab Results  Component Value Date   NA 143 10/18/2020   K 4.8 10/18/2020   CO2 21 10/18/2020   GLUCOSE 82 10/18/2020   BUN 6 10/18/2020   CREATININE 0.78 10/18/2020   CALCIUM 9.6 10/18/2020   EGFR 89 10/18/2020   GFRNONAA >60 05/09/2018   Lab Results  Component Value Date   CHOL 213 (H) 10/18/2020   HDL 50 10/18/2020   LDLCALC 148 (H) 10/18/2020   TRIG 82 10/18/2020   No results found for: "TSH" No results found for: "HGBA1C" Lab Results  Component Value Date   WBC 11.9 (H) 05/09/2018   HGB 14.9 05/09/2018   HCT 44.1 05/09/2018   MCV 86.1 05/09/2018   PLT 233 05/09/2018   Lab Results  Component Value Date   ALT 14 10/18/2020   AST 20 10/18/2020   ALKPHOS 169 (H) 10/18/2020   BILITOT 0.4 10/18/2020   No results found for: "25OHVITD2", "25OHVITD3", "VD25OH"   Review of Systems  Constitutional:  Positive for weight loss. Negative for chills and fever.  HENT:  Negative for drooling, ear discharge, ear pain and sore throat.   Respiratory:  Positive for cough, shortness of breath and  wheezing. Negative for hemoptysis.   Cardiovascular:  Negative for chest pain, palpitations and leg swelling.  Gastrointestinal:  Negative for abdominal pain, blood in stool, constipation, diarrhea and nausea.  Endocrine: Negative for polydipsia.  Genitourinary:  Negative for dysuria, frequency, hematuria and urgency.  Musculoskeletal:  Negative for back pain, myalgias and neck pain.  Skin:  Negative for rash.  Allergic/Immunologic: Negative for environmental allergies.  Neurological:  Negative for dizziness and headaches.  Hematological:  Does not bruise/bleed easily.  Psychiatric/Behavioral:  Negative for suicidal ideas. The patient is not nervous/anxious.     Patient Active Problem List   Diagnosis Date Noted   Greater trochanteric pain syndrome of right lower extremity 11/20/2021   Hx of colonic polyps    Polyp of descending colon    Status post abdominal hysterectomy 11/23/2020   Hyperlipidemia, mixed 07/03/2018   Benign essential HTN 06/18/2017   Stable angina pectoris ([facility]) 06/18/2017   Current tobacco use 08/23/2014   Cannot sleep 01/06/2003    Allergies  Allergen Reactions   Glucosamine Swelling    Other reaction(s): SWELLING   Shellfish Allergy Swelling   Shellfish-Derived Products     Other reaction(s): SWELLING    Past Surgical History:  Procedure Laterality Date   ABDOMINAL HYSTERECTOMY     AUGMENTATION MAMMAPLASTY Bilateral 2012   CHOLECYSTECTOMY     COLONOSCOPY  2015   repeat in 3 years- Dr Janalyn Rouse- [facility]   COLONOSCOPY WITH  PROPOFOL N/A 03/27/2021   Procedure: COLONOSCOPY WITH BIOPSY;  Surgeon: Lucilla Lame, MD;  Location: [facility];  Service: Endoscopy;  Laterality: N/A;   POLYPECTOMY N/A 03/27/2021   Procedure: POLYPECTOMY;  Surgeon: Lucilla Lame, MD;  Location: [facility];  Service: Endoscopy;  Laterality: N/A;   TONSILLECTOMY      Social History   Tobacco Use   Smoking status: Every Day    Packs/day: 0.75    Years: 35.00     Total pack years: 26.25    Types: Cigarettes   Smokeless tobacco: Never   Tobacco comments:    Started smoking early 77s  Vaping Use   Vaping Use: Never used  Substance Use Topics   Alcohol use: Not Currently   Drug use: No     Medication list has been reviewed and updated.  No outpatient medications have been marked as taking for the 01/22/22 encounter (Office Visit) with Juline Patch, MD.       01/22/2022    4:15 PM 01/11/2022   11:07 AM 11/20/2021    3:27 PM 11/14/2021   10:13 AM  GAD 7 : Generalized Anxiety Score  Nervous, Anxious, on Edge 0 0 0 0  Control/stop worrying 0 0 0 0  Worry too much - different things 0 0 0 0  Trouble relaxing 0 0 0 0  Restless 0 0 0 0  Easily annoyed or irritable 0 0 0 0  Afraid - awful might happen 0 0 0 0  Total GAD 7 Score 0 0 0 0  Anxiety Difficulty Not difficult at all Not difficult at all Not difficult at all Not difficult at all       01/22/2022    4:15 PM  Depression screen PHQ 2/9  Decreased Interest 0  Down, Depressed, Hopeless 0  PHQ - 2 Score 0  Altered sleeping 0  Tired, decreased energy 0  Change in appetite 0  Feeling bad or failure about yourself  0  Trouble concentrating 0  Moving slowly or fidgety/restless 0  Suicidal thoughts 0  PHQ-9 Score 0  Difficult doing work/chores Not difficult at all    BP Readings from Last 3 Encounters:  01/22/22 140/82  01/11/22 106/74  11/20/21 128/88    Physical Exam Vitals and nursing note reviewed.  HENT:     Right Ear: Tympanic membrane and ear canal normal.     Left Ear: Tympanic membrane normal.     Nose: Nose normal.  Cardiovascular:     Heart sounds: Normal heart sounds. No murmur heard.    No gallop.  Pulmonary:     Breath sounds: Examination of the right-lower field reveals decreased breath sounds. Decreased breath sounds present. No wheezing, rhonchi or rales.     Wt Readings from Last 3 Encounters:  01/22/22 180 lb (81.6 kg)  01/11/22 174 lb (78.9 kg)   11/20/21 179 lb 3.2 oz (81.3 kg)    BP 140/82   Pulse 78   Temp 98.3 F (36.8 C) (Oral)   Ht _0  (1.702 m)   Wt 180 lb (81.6 kg)   SpO2 97%   BMI 28.19 kg/m   Assessment and Plan:  1. Acute cough Recurrent.  Persistent.  Nonproductive cough previously on prednisone, azithromycin, and inhaler albuterol.  Over the past 7 days the cough is returned.  He examination today notes decreased breath sounds in the right posterior lobe perhaps the right middle lobe.  We will change to Levaquin 500 mg once  a day for 5 days and will call on Friday for update of her condition. - DG Chest 2 View; Future - POC COVID-19 - levofloxacin (LEVAQUIN) 500 MG tablet; Take 1 tablet (500 mg total) by mouth daily for 5 days.  Dispense: 5 tablet; Refill: 0 - guaiFENesin-codeine (ROBITUSSIN AC) 100-10 MG/5ML syrup; Take 5 mLs by mouth 3 (three) times daily as needed for cough.  Dispense: 118 mL; Refill: 0  2. SOB (shortness of breath) Chronic.  Controlled.  Stable.  CT scan noted bullous emphysema in 2013 and chest x-ray also reemphasize emphysema in 2018.  In addition to the albuterol patient has been given an Anora inhaler once a day.  And she will continue to take her albuterol inhaler 1 to 2 puffs every 6 hours.  Cough medicine has been given so she can rest at night - DG Chest 2 View; Future - POC COVID-19 - levofloxacin (LEVAQUIN) 500 MG tablet; Take 1 tablet (500 mg total) by mouth daily for 5 days.  Dispense: 5 tablet; Refill: 0 - guaiFENesin-codeine (ROBITUSSIN AC) 100-10 MG/5ML syrup; Take 5 mLs by mouth 3 (three) times daily as needed for cough.  Dispense: 118 mL; Refill: 0  3. Pneumonia of right lower lobe due to infectious organism Decreased breath sounds in the right posterior lobe right middle lobe I am concerned that there may be a pneumonia and we will change antibiotic to Levaquin.  We will likely be scheduling the patient in the future for a spiral CT to evaluate since she has a greater than  30-pack-year history of smoking. - guaiFENesin-codeine (ROBITUSSIN AC) 100-10 MG/5ML syrup; Take 5 mLs by mouth 3 (three) times daily as needed for cough.  Dispense: 118 mL; Refill: 0  4. Centrilobular emphysema (H[facility]) Chronic.  Persistent.  Stable.  Greater than 30-pack-year history of smoking for which we will likely get a CT scan. - guaiFENesin-codeine (ROBITUSSIN AC) 100-10 MG/5ML syrup; Take 5 mLs by mouth 3 (three) times daily as needed for cough.  Dispense: 118 mL; Refill: 0

## 2022-01-24 ENCOUNTER — Other Ambulatory Visit: Payer: Self-pay

## 2022-01-24 DIAGNOSIS — F17200 Nicotine dependence, unspecified, uncomplicated: Secondary | ICD-10-CM

## 2022-01-24 MED ORDER — BUPROPION HCL ER (SR) 150 MG PO TB12: 150.0000 mg | ORAL_TABLET | Freq: Two times a day (BID) | ORAL | 1 refills | Status: AC

## 2022-03-29 ENCOUNTER — Encounter: Payer: 59 | Admitting: Family Medicine

## 2022-03-31 ENCOUNTER — Ambulatory Visit
Admission: EM | Admit: 2022-03-31 | Discharge: 2022-03-31 | Disposition: A | Discharge status: Home / Self Care | Payer: 59 | Attending: Physician Assistant | Admitting: Physician Assistant

## 2022-03-31 ENCOUNTER — Ambulatory Visit (INDEPENDENT_AMBULATORY_CARE_PROVIDER_SITE_OTHER): Payer: 59

## 2022-03-31 DIAGNOSIS — R202 Paresthesia of skin: Secondary | ICD-10-CM

## 2022-03-31 DIAGNOSIS — M25512 Pain in left shoulder: Secondary | ICD-10-CM | POA: Diagnosis not present

## 2022-03-31 DIAGNOSIS — M79622 Pain in left upper arm: Secondary | ICD-10-CM | POA: Diagnosis not present

## 2022-03-31 DIAGNOSIS — M542 Cervicalgia: Secondary | ICD-10-CM

## 2022-03-31 DIAGNOSIS — M5030 Other cervical disc degeneration, unspecified cervical region: Secondary | ICD-10-CM

## 2022-03-31 DIAGNOSIS — S161XXA Strain of muscle, fascia and tendon at neck level, initial encounter: Secondary | ICD-10-CM | POA: Diagnosis not present

## 2022-03-31 MED ORDER — PREDNISONE 20 MG PO TABS: 40.0000 mg | ORAL_TABLET | Freq: Every day | ORAL | 0 refills | Status: AC

## 2022-03-31 MED ORDER — HYDROCODONE-ACETAMINOPHEN 5-325 MG PO TABS: 1.0000 | ORAL_TABLET | Freq: Four times a day (QID) | ORAL | 0 refills | Status: AC | PRN

## 2022-03-31 MED ORDER — CYCLOBENZAPRINE HCL 10 MG PO TABS: 10.0000 mg | ORAL_TABLET | Freq: Three times a day (TID) | ORAL | 0 refills | Status: AC | PRN
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
## 2022-03-31 NOTE — Discharge Instructions (Addendum)
NECK PAIN: Stressed avoiding painful activities. This can exacerbate your symptoms and make them worse.  May apply heat to the areas of pain for some relief. Use medications as directed. Be aware of which medications make you drowsy and do not drive or operate any kind of heavy machinery while using the medication (ie pain medications or muscle relaxers). F/U with PCP for reexamination or return sooner if condition worsens or does not begin to improve over the next few days.   NECK PAIN RED FLAGS: If symptoms get worse than they are right now, you should come back sooner for re-evaluation. If you have increased numbness/ tingling or notice that the numbness/tingling is affecting the legs or saddle region, go to ER. If you ever lose continence go to ER.      If your neck pain worsens or you have increased numbness/tingling of your arm, chest pain, breathing difficulty, or are feeling worse at all especially if you have worsening headache, increased dizziness, vomiting, call 911 or have someone take to the ER.
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
## 2022-04-03 ENCOUNTER — Ambulatory Visit: Payer: Self-pay | Admitting: Family Medicine

## 2022-04-06 ENCOUNTER — Inpatient Hospital Stay
Admission: RE | Admit: 2022-04-06 | Discharge: 2022-04-06 | Disposition: A | Discharge status: Home / Self Care | Payer: 59 | Source: Ambulatory Visit | Attending: Family Medicine | Admitting: Family Medicine

## 2022-08-17 DIAGNOSIS — M5412 Radiculopathy, cervical region: Secondary | ICD-10-CM | POA: Diagnosis not present

## 2022-08-28 DIAGNOSIS — M5412 Radiculopathy, cervical region: Secondary | ICD-10-CM | POA: Diagnosis not present

## 2022-09-03 DIAGNOSIS — M5412 Radiculopathy, cervical region: Secondary | ICD-10-CM | POA: Diagnosis not present

## 2022-09-20 DIAGNOSIS — M5412 Radiculopathy, cervical region: Secondary | ICD-10-CM | POA: Diagnosis not present

## 2022-09-20 DIAGNOSIS — Z4889 Encounter for other specified surgical aftercare: Secondary | ICD-10-CM | POA: Diagnosis not present

## 2022-10-05 DIAGNOSIS — G8918 Other acute postprocedural pain: Secondary | ICD-10-CM | POA: Diagnosis not present

## 2022-10-26 DIAGNOSIS — Z4889 Encounter for other specified surgical aftercare: Secondary | ICD-10-CM | POA: Diagnosis not present

## 2022-12-07 DIAGNOSIS — Z4889 Encounter for other specified surgical aftercare: Secondary | ICD-10-CM | POA: Diagnosis not present
